# Patient Record
Sex: Female | Born: 1960 | Race: White | Hispanic: No | Smoking: Never smoker
Health system: Southern US, Community
[De-identification: ages and names within clinical notes are randomized; demographics above are authoritative.]

## PROBLEM LIST (undated history)

## (undated) DIAGNOSIS — C50919 Malignant neoplasm of unspecified site of unspecified female breast: Secondary | ICD-10-CM

## (undated) DIAGNOSIS — E785 Hyperlipidemia, unspecified: Secondary | ICD-10-CM

## (undated) DIAGNOSIS — F4541 Pain disorder exclusively related to psychological factors: Secondary | ICD-10-CM

## (undated) DIAGNOSIS — Z923 Personal history of irradiation: Secondary | ICD-10-CM

## (undated) DIAGNOSIS — I10 Essential (primary) hypertension: Secondary | ICD-10-CM

## (undated) HISTORY — PX: BREAST LUMPECTOMY: SHX2

## (undated) HISTORY — DX: Essential (primary) hypertension: I10

## (undated) HISTORY — PX: VAGINAL HYSTERECTOMY: SUR661

## (undated) HISTORY — PX: GANGLION CYST EXCISION: SHX1691

## (undated) HISTORY — DX: Hyperlipidemia, unspecified: E78.5

---

## 2012-06-18 ENCOUNTER — Encounter: Payer: Self-pay | Admitting: Internal Medicine

## 2012-08-02 ENCOUNTER — Encounter: Payer: Self-pay | Admitting: Internal Medicine

## 2012-08-06 ENCOUNTER — Ambulatory Visit (INDEPENDENT_AMBULATORY_CARE_PROVIDER_SITE_OTHER): Payer: BC Managed Care – PPO | Admitting: Internal Medicine

## 2012-08-06 ENCOUNTER — Encounter: Payer: Self-pay | Admitting: Internal Medicine

## 2012-08-06 VITALS — BP 120/78 | HR 80 | Ht 62.5 in | Wt 139.6 lb

## 2012-08-06 DIAGNOSIS — K5900 Constipation, unspecified: Secondary | ICD-10-CM

## 2012-08-06 DIAGNOSIS — K5904 Chronic idiopathic constipation: Secondary | ICD-10-CM | POA: Insufficient documentation

## 2012-08-06 MED ORDER — LINACLOTIDE 145 MCG PO CAPS: 145.0000 ug | ORAL_CAPSULE | Freq: Every day | ORAL | Status: DC

## 2012-08-06 NOTE — Progress Notes (Addendum)
Patient ID: Tonya Oconnell, female   DOB: [birth year], 51 y.o.   MRN: [id number]  SUBJECTIVE: HPI Tonya Oconnell is a 51 yo female with PMH of chronic constipation who seen for evaluation of chronic constipation. The patient is alone today. The patient reports that she has suffered from constipation for years, even decades. She has used MiraLAX 17 g once to twice daily on an ongoing basis for some time. This has not led to the feeling of complete evacuation, though it does induce stools for her. She reports without laxatives she has a bowel movement every 3-4 days, perhaps longer. When taking MiraLAX she has more frequent bowel movements, but notes and the small and often thin.  Her stools are brown without blood or melena. She denies abdominal pain. No nausea or vomiting. No weight loss. She does note rare rectal spasm, which she states happens very infrequently and last about 10 seconds. However, for those 10 seconds the pain can be severe. She has been evaluated by Dr. 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  She reports 2 prior colonoscopies one in 2005 and the other on 07/14/2011. She reports these are a waste reported as normal without concern. She does note borborygmi at night.  Review of Systems  As per history of present illness, otherwise negative   No past medical history on file.  Current Outpatient Prescriptions  Medication Sig Dispense Refill  . gabapentin (NEURONTIN) 300 MG capsule Take 300 mg by mouth daily.      . polyethylene glycol (MIRALAX / GLYCOLAX) packet Take 17 g by mouth 2 (two) times daily.      . valACYclovir (VALTREX) 1000 MG tablet Take 1,000 mg by mouth daily.      . Linaclotide (LINZESS) 145 MCG CAPS Take 1 capsule (145 mcg total) by mouth daily.  30 capsule  3    Allergies  Allergen Reactions  . Sulfa Antibiotics Swelling    Family History  Problem Relation Age of Onset  . Colon polyps Father     History  Substance  Use Topics  . Smoking status: Never Smoker   . Smokeless tobacco: Never Used  . Alcohol Use: Yes    OBJECTIVE: BP 120/78  Pulse 80  Ht 5' 2.5" (1.588 m)  Wt 139 lb 9.6 oz (63.322 kg)  BMI 25.13 kg/m2 Constitutional: Well-developed and well-nourished. No distress. HEENT: Normocephalic and atraumatic. Oropharynx is clear and moist. No oropharyngeal exudate. Conjunctivae are normal. Pupils are equal round and reactive to light. No scleral icterus. Neck: Neck supple. Trachea midline. Cardiovascular: Normal rate, regular rhythm and intact distal pulses. No M/R/G Pulmonary/chest: Effort normal and breath sounds normal. No wheezing, rales or rhonchi. Abdominal: Soft, nontender, nondistended. Bowel sounds active throughout. There are no masses palpable. No hepatosplenomegaly. Extremities: no clubbing, cyanosis, or edema Lymphadenopathy: No cervical adenopathy noted. Neurological: Alert and oriented to person place and time. Skin: Skin is warm and dry. No rashes noted. Psychiatric: Normal mood and affect. Behavior is normal.  Labs and Imaging -- Awaiting records from prior colonoscopy. She also notes her thyroid function is been checked in the last year and she was told it was normal  ASSESSMENT AND PLAN: 51 yo female with PMH of chronic constipation who seen for evaluation of chronic constipation.  1. Chronic idiopathic constipation -- she does not have much in the way of irritable bowel symptoms, and I do think her constipation is more consistent with the chronic idiopathic  variety.  It seems that MiraLAX is inducing bowel movements, but for her these are incomplete and ineffective. I would like to review her previous colonoscopies to ensure that the preps were good and the exams complete. If this is the case, as I expect it is, then I do not think she needs repeat colonoscopy at this time. I recommend a trial of LINZESS 145 mcg daily. We discussed how if necessary, this dose can be increased  to 290 mcg daily. Also like her to discontinue MiraLAX at this time. I will see her back in 2 months time. I have encouraged her to stay well hydrated and eat a well-balanced diet which is rich in fiber.  2. CRC screening -- 51 yo, however she has already had colonoscopy and we will obtain these records  Addendum: Colonoscopy performed 07/14/2011 -- good prep. Exam to the cecum. Normal colonoscopy, recommended repeat 5 years due to family history, Dr. Ree Kida Spainhour

## 2012-08-06 NOTE — Patient Instructions (Addendum)
We have sent the following medications to your pharmacy for you to pick up at your convenience: Linzess  Stop taking miralax  Follow up with Dr. Rhea Belton in 6-8 Weeks

## 2012-08-08 ENCOUNTER — Telehealth: Payer: Self-pay | Admitting: Internal Medicine

## 2012-08-08 NOTE — Telephone Encounter (Signed)
Pt was here on 08/06/12 and was given Linzess. Per Dr Lauro Franklin note, she was to take 145 mcg daily and if ineffective she is to increase to 290 mcg. Pt reports she was reading over her instructions and she was given 290 mcg instead. Pt took that dose and had 3 stools, loose. She lives too far away for samples so she will call to update me on whether to order a script. She uses [facility].

## 2012-08-08 NOTE — Telephone Encounter (Signed)
This dose may be too high for her and if she has too frequent of loose stool then I would decrease to 145 mcg daily

## 2012-08-16 ENCOUNTER — Telehealth: Payer: Self-pay | Admitting: *Deleted

## 2012-08-16 DIAGNOSIS — K5904 Chronic idiopathic constipation: Secondary | ICD-10-CM

## 2012-08-16 MED ORDER — LINACLOTIDE 145 MCG PO CAPS: 145.0000 ug | ORAL_CAPSULE | Freq: Every day | ORAL | Status: DC

## 2012-08-16 NOTE — Telephone Encounter (Signed)
Spoke to pt and she said Linzess is working for her she has taken it for the past 10 days and has had normal stools for 7 of those 10 days. Asked if I could send [facility] an [facility]. Rx has been sent.

## 2012-09-18 ENCOUNTER — Encounter: Payer: Self-pay | Admitting: Internal Medicine

## 2012-09-19 ENCOUNTER — Encounter: Payer: Self-pay | Admitting: Internal Medicine

## 2012-09-19 ENCOUNTER — Ambulatory Visit (INDEPENDENT_AMBULATORY_CARE_PROVIDER_SITE_OTHER): Payer: BC Managed Care – PPO | Admitting: Internal Medicine

## 2012-09-19 ENCOUNTER — Ambulatory Visit: Payer: BC Managed Care – PPO | Admitting: Internal Medicine

## 2012-09-19 VITALS — BP 116/82 | HR 84 | Ht 62.75 in | Wt 140.4 lb

## 2012-09-19 DIAGNOSIS — K5900 Constipation, unspecified: Secondary | ICD-10-CM

## 2012-09-19 DIAGNOSIS — K5904 Chronic idiopathic constipation: Secondary | ICD-10-CM

## 2012-09-19 MED ORDER — LINACLOTIDE 145 MCG PO CAPS: 145.0000 ug | ORAL_CAPSULE | Freq: Every day | ORAL | Status: DC

## 2012-09-19 NOTE — Patient Instructions (Addendum)
We have sent the following medications to your pharmacy for you to pick up at your convenience:  Linzess    

## 2012-09-19 NOTE — Progress Notes (Signed)
  Subjective:    Patient ID: Tonya Oconnell, female    DOB: 05-06-1961, 51 y.o.   MRN: [id number]  HPI Tonya Oconnell is a 51 yo female with PMH of chronic constipation who seen in followup. She is alone today. The patient was prescribed LINZESS 290 mcg, but this was causing too frequent stools with some urgency, and she was decreased to 145 mcg daily. This has worked very well for her and she is very happy with the results. She is now having a bowel movement nearly every day and it feels complete. She has stopped MiraLAX. She continues to abdominal pain, nausea, vomiting. Appetite is good. Still sporadic borborygmi, but not painful or disturbing to her. No fevers or chills.  Review of Systems As per history of present illness, otherwise negative  Current Medications, Allergies, Past Medical History, Past Surgical History, Family History and Social History were reviewed in [employer] record.      Objective:   Physical Exam BP 116/82  Pulse 84  Ht 5' 2.75" (1.594 m)  Wt 140 lb 6 oz (63.674 kg)  BMI 25.06 kg/m2 Constitutional: Well-developed and well-nourished. No distress. HEENT: Normocephalic and atraumatic. Oropharynx is clear and moist. No oropharyngeal exudate. Conjunctivae are normal.  No scleral icterus. Cardiovascular: Normal rate, regular rhythm and intact distal pulses. No M/R/G Pulmonary/chest: Effort normal and breath sounds normal. No wheezing, rales or rhonchi. Abdominal: Soft, nontender, nondistended. Bowel sounds active throughout. There are no masses palpable. No hepatosplenomegaly. Extremities: no clubbing, cyanosis, or edema Neurological: Alert and oriented to person place and time. Skin: Skin is warm and dry. No rashes noted. Psychiatric: Normal mood and affect. Behavior is normal.  Records reviewed and colonoscopy was performed in [place], [place] on 07/14/2011. Exam was to the cecum.      Assessment & Plan:  51 year old female who seen in  followup with chronic idiopathic constipation.  1. Chronic idiopathic constipation -- she has had an excellent response to [facility] and we'll continue 145 mcg daily. Refills will be given. I will see her again in one year, or sooner if needed.  2.  CRC screening, fam hx of CRC -- the patient's records reviewed and she is up-to-date on screening, she will be due repeat screening July 2017. This is a 5 year interval based on her family history.

## 2013-08-09 ENCOUNTER — Other Ambulatory Visit: Payer: Self-pay | Admitting: Internal Medicine

## 2014-03-30 ENCOUNTER — Other Ambulatory Visit: Payer: Self-pay | Admitting: Internal Medicine

## 2015-03-12 ENCOUNTER — Other Ambulatory Visit (INDEPENDENT_AMBULATORY_CARE_PROVIDER_SITE_OTHER): Payer: Self-pay | Admitting: Otolaryngology

## 2015-03-12 DIAGNOSIS — R609 Edema, unspecified: Secondary | ICD-10-CM

## 2015-03-17 ENCOUNTER — Ambulatory Visit (HOSPITAL_COMMUNITY)
Admission: RE | Admit: 2015-03-17 | Discharge: 2015-03-17 | Disposition: A | Discharge status: Home / Self Care | Payer: BLUE CROSS/BLUE SHIELD | Source: Ambulatory Visit | Attending: Otolaryngology | Admitting: Otolaryngology

## 2015-03-17 DIAGNOSIS — R609 Edema, unspecified: Secondary | ICD-10-CM

## 2015-03-17 DIAGNOSIS — R221 Localized swelling, mass and lump, neck: Secondary | ICD-10-CM | POA: Diagnosis not present

## 2015-03-17 MED ORDER — IOHEXOL 300 MG/ML  SOLN
75.0000 mL | Freq: Once | INTRAMUSCULAR | Status: AC | PRN
  Administered 2015-03-17: 75 mL via INTRAVENOUS

## 2015-07-06 ENCOUNTER — Telehealth: Payer: Self-pay | Admitting: Internal Medicine

## 2015-07-06 NOTE — Telephone Encounter (Signed)
Pt states she is taking 2 of the lizess 124mcg now and that sometimes they work and sometimes they don't. Reports that she is having to take a laxative at least once a week in addition to the linzess. Pt states that when she does go the stool is loose. States that it may be 2-3 days before she goes to the bathroom. Please advise.

## 2015-07-06 NOTE — Telephone Encounter (Signed)
Patient's did not always go every day when taking Linzess. Can increase to 290 g daily. Probably best to be seen again in the office to discuss symptoms. Additional over-the-counter laxative can be used as needed until follow-up

## 2015-07-06 NOTE — Telephone Encounter (Signed)
Pt is currently taking 290 daily. Pt will continue trying the higher dose along with laxative as needed. Left message for pt to call back.   Pt scheduled to see Dr. Hilarie Fredrickson 07/08/15@10am . Pt aware of appt.

## 2015-07-08 ENCOUNTER — Encounter: Payer: Self-pay | Admitting: Internal Medicine

## 2015-07-08 ENCOUNTER — Ambulatory Visit (INDEPENDENT_AMBULATORY_CARE_PROVIDER_SITE_OTHER): Payer: BLUE CROSS/BLUE SHIELD | Admitting: Internal Medicine

## 2015-07-08 ENCOUNTER — Telehealth: Payer: Self-pay | Admitting: Internal Medicine

## 2015-07-08 VITALS — BP 118/70 | HR 68 | Ht 63.0 in | Wt 149.0 lb

## 2015-07-08 DIAGNOSIS — M545 Low back pain, unspecified: Secondary | ICD-10-CM

## 2015-07-08 DIAGNOSIS — K5909 Other constipation: Secondary | ICD-10-CM

## 2015-07-08 DIAGNOSIS — R194 Change in bowel habit: Secondary | ICD-10-CM

## 2015-07-08 DIAGNOSIS — K5900 Constipation, unspecified: Secondary | ICD-10-CM | POA: Diagnosis not present

## 2015-07-08 MED ORDER — NA SULFATE-K SULFATE-MG SULF 17.5-3.13-1.6 GM/177ML PO SOLN: 1.0000 | Freq: Once | ORAL | Status: DC

## 2015-07-08 MED ORDER — LUBIPROSTONE 8 MCG PO CAPS: 8.0000 ug | ORAL_CAPSULE | Freq: Two times a day (BID) | ORAL | Status: DC

## 2015-07-08 NOTE — Patient Instructions (Signed)
Discontinue Linzess Start Amitiza 8 mcg Use Benefiber 1 TBS daily for 7 days Then increase to 2 tablespoons daily  You have been scheduled for a colonoscopy. Please follow written instructions given to you at your visit today.  Please pick up your prep supplies at the pharmacy within the next 1-3 days. If you use inhalers (even only as needed), please bring them with you on the day of your procedure. Your physician has requested that you go to www.startemmi.com and enter the access code given to you at your visit today. This web site gives a general overview about your procedure. However, you should still follow specific instructions given to you by our office regarding your preparation for the procedure.

## 2015-07-08 NOTE — Telephone Encounter (Signed)
Informed patient that we can mail her coupons for both Amitiza and Suprep. Pt verbalized understanding.

## 2015-07-08 NOTE — Progress Notes (Signed)
Subjective:    Patient ID: Tonya Oconnell, female    DOB: April 15, 1961, 54 y.o.   MRN: [id number]  HPI Tonya Oconnell is a 54 year old female with past medical history of chronic constipation who seen in follow-up. She was last seen in October 2013.  She has been taking Linzess for the last 2 years at 145 g daily. Despite this she is still having issues with incomplete evacuation. She reports that Linzess causes loose runny stools and this is always been the case. She is not having this every day but whenever she has a bowel movement while using the medication it is always very loose. She denies abdominal pain. She does report feeling incomplete evacuation. She stopped Linzess for several days to see what would happen and increase the fiber in her diet. She reports that she was having bowel movements but they were thin in caliber. She denies rectal bleeding or melena. She's had some lower back pain just above her pelvis. She is worried because she had a friend with lower back pain he was later diagnosed with colon cancer. She does not have a family history of colon cancer but her father had colon polyps. Her last colonoscopy was in July 2012 with Dr. West Carbo.  This test was normal. She does report that it feels like "my stool never makes it to the bottom". She did recently increase Linzess to 290 g daily but had no change in bowel habits   Review of Systems As per history of present illness, otherwise negative  Current Medications, Allergies, Past Medical History, Past Surgical History, Family History and Social History were reviewed in [employer] record.     Objective:   Physical Exam BP 118/70 mmHg  Pulse 68  Ht 5\' 3"  (1.6 m)  Wt 149 lb (67.586 kg)  BMI 26.40 kg/m2 Constitutional: Well-developed and well-nourished. No distress. HEENT: Normocephalic and atraumatic. Oropharynx is clear and moist. No oropharyngeal exudate. Conjunctivae are normal.  No scleral  icterus. Neck: Neck supple. Trachea midline. Cardiovascular: Normal rate, regular rhythm and intact distal pulses. No M/R/G Pulmonary/chest: Effort normal and breath sounds normal. No wheezing, rales or rhonchi. Abdominal: Soft, nontender, nondistended. Bowel sounds active throughout. There are no masses palpable. No hepatosplenomegaly. Extremities: no clubbing, cyanosis, or edema Lymphadenopathy: No cervical adenopathy noted. Neurological: Alert and oriented to person place and time. Skin: Skin is warm and dry. No rashes noted. Psychiatric: Normal mood and affect. Behavior is normal.  Colonoscopy reviewed from 07/14/2011 -- normal. Recommended repeat in 5 years. Prep documented as "good"     Assessment & Plan:  54 year old female with past medical history of chronic constipation who seen in follow-up.  1. Chronic constipation/change in bowel habits/lower back pain -- she is nervous about her symptoms and is worried about colon cancer. At tried to provide reassurance today given her normal colonoscopy from 4 years ago. That said, given her anxiousness and her change in bowel habits including thin stools, I recommended repeat colonoscopy at this time. We discussed the risks, benefits and alternatives and she is agreeable to proceed. Linzess has caused diarrhea and this is not the intended result. I would like to add Benefiber 1 tablespoon daily for 1 week increase to twice daily after 1 week. Hopefully this will help form stools aid with complete evacuation. Discontinue Linzess. Begin lubiprostone 8 g twice a day. Low back pain may relate to constipation but more likely is secondary to low back strain. Finally we discussed the possibility of  pelvic floor dysfunction or dyssynergia defecation. If colonoscopy normal and no response to medication consider anorectal manometry  25 minutes spent with the patient today

## 2015-07-15 ENCOUNTER — Telehealth: Payer: Self-pay | Admitting: Internal Medicine

## 2015-07-15 MED ORDER — NA SULFATE-K SULFATE-MG SULF 17.5-3.13-1.6 GM/177ML PO SOLN: 1.0000 | Freq: Once | ORAL | Status: DC

## 2015-07-15 NOTE — Telephone Encounter (Signed)
Free sample placed at front desk. Patient advised.

## 2015-07-17 ENCOUNTER — Telehealth: Payer: Self-pay | Admitting: Internal Medicine

## 2015-07-17 NOTE — Telephone Encounter (Signed)
Patient reports that she is not having any results from 8 mcg BID amitiza and Benefiber.  She is advised to add Miralax 1-2 doses a day until Dr. Hilarie Fredrickson can review on Monday

## 2015-07-21 NOTE — Telephone Encounter (Signed)
Left message for pt to call back  °

## 2015-07-21 NOTE — Telephone Encounter (Signed)
Increase Amitiza to 24 g twice a day and colonoscopy is pending

## 2015-07-24 NOTE — Telephone Encounter (Signed)
Left message for pt to call back  °

## 2015-07-24 NOTE — Telephone Encounter (Signed)
Pt aware and states she will take the 24mcg pills 3 BID to equal 67mcg dose. Pt will call back and let us know when she needs a new prescription.

## 2015-08-10 ENCOUNTER — Encounter: Payer: Self-pay | Admitting: Internal Medicine

## 2015-08-10 ENCOUNTER — Ambulatory Visit (AMBULATORY_SURGERY_CENTER): Payer: BLUE CROSS/BLUE SHIELD | Admitting: Internal Medicine

## 2015-08-10 VITALS — BP 120/78 | HR 73 | Temp 97.3°F | Resp 20 | Ht 63.0 in | Wt 149.0 lb

## 2015-08-10 DIAGNOSIS — K5900 Constipation, unspecified: Secondary | ICD-10-CM

## 2015-08-10 DIAGNOSIS — R194 Change in bowel habit: Secondary | ICD-10-CM | POA: Diagnosis present

## 2015-08-10 DIAGNOSIS — D120 Benign neoplasm of cecum: Secondary | ICD-10-CM | POA: Diagnosis not present

## 2015-08-10 DIAGNOSIS — K5909 Other constipation: Secondary | ICD-10-CM

## 2015-08-10 HISTORY — PX: COLONOSCOPY WITH PROPOFOL: SHX5780

## 2015-08-10 MED ORDER — SODIUM CHLORIDE 0.9 % IV SOLN: 500.0000 mL | INTRAVENOUS | Status: DC

## 2015-08-10 NOTE — Op Note (Signed)
[place]  [address] [place], [id number]   COLONOSCOPY PROCEDURE REPORT  PATIENT: Tonya Oconnell, Tonya Oconnell  MR#: [id number] BIRTHDATE: Jan 20, 1961 , 54  yrs. old GENDER: female ENDOSCOPIST: Jerene Bears, MD PROCEDURE DATE:  08/10/2015 PROCEDURE:   Colonoscopy, diagnostic and Colonoscopy with snare polypectomy First Screening Colonoscopy - Avg.  risk and is 50 yrs.  old or older - No.  Prior Negative Screening - Now for repeat screening. Less than 10 yrs Prior Negative Screening - Now for repeat screening.  Other: See Comments  History of Adenoma - Now for follow-up colonoscopy & has been > or = to 3 yrs.  N/A  Polyps removed today? Yes ASA CLASS:   Class II INDICATIONS:change in bowel habits and chronic constipation.  normal colonoscopy at outside facility in 2012 MEDICATIONS: Monitored anesthesia care and Propofol 200 mg IV  DESCRIPTION OF PROCEDURE:   After the risks benefits and alternatives of the procedure were thoroughly explained, informed consent was obtained.  The digital rectal exam revealed no rectal mass.   The LB [id number]  endoscope was introduced through the anus and advanced to the cecum, which was identified by both the appendix and ileocecal valve. No adverse events experienced. The quality of the prep was good.  (Suprep was used)  The instrument was then slowly withdrawn as the colon was fully examined. Estimated blood loss is zero unless otherwise noted in this procedure report.  COLON FINDINGS: A sessile polyp measuring 8 mm in size was found at the cecum.  A polypectomy was performed with a cold snare.  The resection was complete, the polyp tissue was completely retrieved and sent to histology.   The examination was otherwise normal. Retroflexed views revealed internal hemorrhoids. The time to cecum = 3.3 Withdrawal time = 10.2   The scope was withdrawn and the procedure completed. COMPLICATIONS: There were no immediate  complications.  ENDOSCOPIC IMPRESSION: 1.   Sessile polyp was found at the cecum; polypectomy was performed with a cold snare 2.   The examination was otherwise normal  RECOMMENDATIONS: 1.  Await pathology results 2.  If the polyp removed today is proven to be an adenomatous (pre-cancerous) polyp, you will need a repeat colonoscopy in 5 years.  Otherwise you should continue to follow colorectal cancer screening guidelines for "routine risk" patients with colonoscopy in 10 years.  You will receive a letter within 1-2 weeks with the results of your biopsy as well as final recommendations.  Please call my office if you have not received a letter after 3 weeks. 3.  Discontinue Amitiza, resume Benefiber 1-2 tablespoons daily. Office follow-up as needed, call if having ongoing issues with constipation  eSigned:  Jerene Bears, MD 08/10/2015 4:07 PM   cc:  the patient, PCP

## 2015-08-10 NOTE — Progress Notes (Signed)
Called to room to assist during endoscopic procedure.  Patient ID and intended procedure confirmed with present staff. Received instructions for my participation in the procedure from the performing physician.  

## 2015-08-10 NOTE — Patient Instructions (Addendum)
YOU HAD AN ENDOSCOPIC PROCEDURE TODAY AT [facility]:   Refer to the procedure report that was given to you for any specific questions about what was found during the examination.  If the procedure report does not answer your questions, please call your gastroenterologist to clarify.  If you requested that your care partner not be given the details of your procedure findings, then the procedure report has been included in a sealed envelope for you to review at your convenience later.  YOU SHOULD EXPECT: Some feelings of bloating in the abdomen. Passage of more gas than usual.  Walking can help get rid of the air that was put into your GI tract during the procedure and reduce the bloating. If you had a lower endoscopy (such as a colonoscopy or flexible sigmoidoscopy) you may notice spotting of blood in your stool or on the toilet paper. If you underwent a bowel prep for your procedure, you may not have a normal bowel movement for a few days.  Please Note:  You might notice some irritation and congestion in your nose or some drainage.  This is from the oxygen used during your procedure.  There is no need for concern and it should clear up in a day or so.  SYMPTOMS TO REPORT IMMEDIATELY:   Following lower endoscopy (colonoscopy or flexible sigmoidoscopy):  Excessive amounts of blood in the stool  Significant tenderness or worsening of abdominal pains  Swelling of the abdomen that is new, acute  Fever of 100F or higher     For urgent or emergent issues, a gastroenterologist can be reached at any hour by calling [phone].   DIET: Your first meal following the procedure should be a small meal and then it is ok to progress to your normal diet. Heavy or fried foods are harder to digest and may make you feel nauseous or bloated.  Likewise, meals heavy in dairy and vegetables can increase bloating.  Drink plenty of fluids but you should avoid alcoholic beverages for 24  hours.  ACTIVITY:  You should plan to take it easy for the rest of today and you should NOT DRIVE or use heavy machinery until tomorrow (because of the sedation medicines used during the test).    FOLLOW UP: Our staff will call the number listed on your records the next business day following your procedure to check on you and address any questions or concerns that you may have regarding the information given to you following your procedure. If we do not reach you, we will leave a message.  However, if you are feeling well and you are not experiencing any problems, there is no need to return our call.  We will assume that you have returned to your regular daily activities without incident.  If any biopsies were taken you will be contacted by phone or by letter within the next 1-3 weeks.  Please call us at [phone] if you have not heard about the biopsies in 3 weeks.    SIGNATURES/CONFIDENTIALITY: You and/or your care partner have signed paperwork which will be entered into your electronic medical record.  These signatures attest to the fact that that the information above on your After Visit Summary has been reviewed and is understood.  Full responsibility of the confidentiality of this discharge information lies with you and/or your care-partner.   INFORMATION ON POLYPS GIVEN TO YOU TODAY  BENEFIBER 1-2 TBSP DAILY AS DIRECTED

## 2015-08-10 NOTE — Progress Notes (Signed)
Transferred to recovery room. A/O x3, pleased with MAC.  VSS.  Report to Penny, RN. 

## 2015-08-11 ENCOUNTER — Telehealth: Payer: Self-pay | Admitting: Emergency Medicine

## 2015-08-11 NOTE — Telephone Encounter (Signed)
  Follow up Call-  Call back number 08/10/2015  Post procedure Call Back phone  # [phone]  Permission to leave phone message Yes     Patient questions:  Do you have a fever, pain , or abdominal swelling? No. Pain Score  0 *  Have you tolerated food without any problems? Yes.    Have you been able to return to your normal activities? Yes.    Do you have any questions about your discharge instructions: Diet   No. Medications  No. Follow up visit  No.  Do you have questions or concerns about your Care? No.  Actions: * If pain score is 4 or above: No action needed, pain <4.

## 2015-08-19 ENCOUNTER — Encounter: Payer: Self-pay | Admitting: Internal Medicine

## 2015-08-26 ENCOUNTER — Telehealth: Payer: Self-pay | Admitting: Internal Medicine

## 2015-08-26 NOTE — Telephone Encounter (Signed)
Discussed with pt that the anesthesia should be well out of her system by now and should not be causing the swelling, pt had colon done 08/10/15. Pt states she will discuss with her PCP.

## 2016-04-18 ENCOUNTER — Encounter: Payer: Self-pay | Admitting: Internal Medicine

## 2016-05-09 ENCOUNTER — Telehealth: Payer: Self-pay | Admitting: Internal Medicine

## 2016-05-09 NOTE — Telephone Encounter (Signed)
Reviewed recall letter with pt and explained letter to pt. Questions were answered.

## 2017-04-05 ENCOUNTER — Other Ambulatory Visit: Payer: Self-pay | Admitting: Specialist

## 2017-04-05 DIAGNOSIS — R928 Other abnormal and inconclusive findings on diagnostic imaging of breast: Secondary | ICD-10-CM

## 2017-04-07 ENCOUNTER — Ambulatory Visit
Admission: RE | Admit: 2017-04-07 | Discharge: 2017-04-07 | Disposition: A | Discharge status: Home / Self Care | Payer: BLUE CROSS/BLUE SHIELD | Source: Ambulatory Visit | Attending: Specialist | Admitting: Specialist

## 2017-04-07 DIAGNOSIS — R928 Other abnormal and inconclusive findings on diagnostic imaging of breast: Secondary | ICD-10-CM

## 2017-04-12 ENCOUNTER — Telehealth: Payer: Self-pay | Admitting: *Deleted

## 2017-04-12 NOTE — Telephone Encounter (Deleted)
Left vm regarding [place] on 04/19/17. Contact information provided for return call.

## 2017-04-12 NOTE — Telephone Encounter (Signed)
error 

## 2017-04-12 NOTE — Telephone Encounter (Signed)
Confirmed [facility] for 04/19/17 at 1215 .  Instructions and contact information given.

## 2017-04-17 ENCOUNTER — Encounter: Payer: Self-pay | Admitting: *Deleted

## 2017-04-17 ENCOUNTER — Other Ambulatory Visit: Payer: Self-pay | Admitting: *Deleted

## 2017-04-17 DIAGNOSIS — Z170 Estrogen receptor positive status [ER+]: Principal | ICD-10-CM

## 2017-04-17 DIAGNOSIS — C50411 Malignant neoplasm of upper-outer quadrant of right female breast: Secondary | ICD-10-CM

## 2017-04-18 ENCOUNTER — Encounter: Payer: Self-pay | Admitting: Genetics

## 2017-04-18 DIAGNOSIS — C50919 Malignant neoplasm of unspecified site of unspecified female breast: Secondary | ICD-10-CM

## 2017-04-18 HISTORY — DX: Malignant neoplasm of unspecified site of unspecified female breast: C50.919

## 2017-04-19 ENCOUNTER — Other Ambulatory Visit (HOSPITAL_BASED_OUTPATIENT_CLINIC_OR_DEPARTMENT_OTHER): Payer: BLUE CROSS/BLUE SHIELD

## 2017-04-19 ENCOUNTER — Encounter: Payer: Self-pay | Admitting: Hematology and Oncology

## 2017-04-19 ENCOUNTER — Ambulatory Visit
Admission: RE | Admit: 2017-04-19 | Discharge: 2017-04-19 | Disposition: A | Discharge status: Home / Self Care | Payer: BLUE CROSS/BLUE SHIELD | Source: Ambulatory Visit | Attending: Radiation Oncology | Admitting: Radiation Oncology

## 2017-04-19 ENCOUNTER — Ambulatory Visit: Payer: BLUE CROSS/BLUE SHIELD | Attending: Surgery | Admitting: Physical Therapy

## 2017-04-19 ENCOUNTER — Ambulatory Visit: Payer: Self-pay | Admitting: Surgery

## 2017-04-19 ENCOUNTER — Ambulatory Visit (HOSPITAL_BASED_OUTPATIENT_CLINIC_OR_DEPARTMENT_OTHER): Payer: BLUE CROSS/BLUE SHIELD | Admitting: Hematology and Oncology

## 2017-04-19 ENCOUNTER — Encounter: Payer: Self-pay | Admitting: Physical Therapy

## 2017-04-19 DIAGNOSIS — Z170 Estrogen receptor positive status [ER+]: Secondary | ICD-10-CM | POA: Insufficient documentation

## 2017-04-19 DIAGNOSIS — C50411 Malignant neoplasm of upper-outer quadrant of right female breast: Secondary | ICD-10-CM | POA: Diagnosis not present

## 2017-04-19 DIAGNOSIS — Z8371 Family history of colonic polyps: Secondary | ICD-10-CM | POA: Diagnosis not present

## 2017-04-19 DIAGNOSIS — R293 Abnormal posture: Secondary | ICD-10-CM | POA: Diagnosis present

## 2017-04-19 LAB — COMPREHENSIVE METABOLIC PANEL
ALT: 14 U/L (ref 0–55)
AST: 16 U/L (ref 5–34)
Albumin: 4.1 g/dL (ref 3.5–5.0)
Alkaline Phosphatase: 84 U/L (ref 40–150)
Anion Gap: 11 mEq/L (ref 3–11)
BILIRUBIN TOTAL: 0.29 mg/dL (ref 0.20–1.20)
BUN: 9.7 mg/dL (ref 7.0–26.0)
CO2: 28 meq/L (ref 22–29)
Calcium: 9.6 mg/dL (ref 8.4–10.4)
Chloride: 105 mEq/L (ref 98–109)
Creatinine: 1 mg/dL (ref 0.6–1.1)
EGFR: 68 mL/min/{1.73_m2} — AB (ref >90)
GLUCOSE: 113 mg/dL (ref 70–140)
Potassium: 3.8 mEq/L (ref 3.5–5.1)
SODIUM: 144 meq/L (ref 136–145)
TOTAL PROTEIN: 7.4 g/dL (ref 6.4–8.3)

## 2017-04-19 LAB — CBC WITH DIFFERENTIAL/PLATELET
BASO%: 0.8 % (ref 0.0–2.0)
Basophils Absolute: 0.1 10*3/uL (ref 0.0–0.1)
EOS%: 2 % (ref 0.0–7.0)
Eosinophils Absolute: 0.1 10*3/uL (ref 0.0–0.5)
HCT: 40.1 % (ref 34.8–46.6)
HEMOGLOBIN: 13.4 g/dL (ref 11.6–15.9)
LYMPH%: 30 % (ref 14.0–49.7)
MCH: 29.3 pg (ref 25.1–34.0)
MCHC: 33.4 g/dL (ref 31.5–36.0)
MCV: 87.7 fL (ref 79.5–101.0)
MONO#: 0.3 10*3/uL (ref 0.1–0.9)
MONO%: 4.9 % (ref 0.0–14.0)
NEUT#: 3.8 10*3/uL (ref 1.5–6.5)
NEUT%: 62.3 % (ref 38.4–76.8)
NRBC: 0 % (ref 0–0)
Platelets: 360 10*3/uL (ref 145–400)
RBC: 4.57 10*6/uL (ref 3.70–5.45)
RDW: 14 % (ref 11.2–14.5)
WBC: 6.1 10*3/uL (ref 3.9–10.3)
lymph#: 1.8 10*3/uL (ref 0.9–3.3)

## 2017-04-19 NOTE — H&P (Signed)
Tonya Oconnell 04/19/2017 7:49 AM Location: [facility] Patient #: [id number] DOB: 1961/09/16 Undefined / Language: Undefined / Race: Refused to Report/Unreported Female  History of Present Illness (Sunday Klos A. Ridhi Hoffert MD; 04/19/2017 3:03 PM) Patient words: Pt sent at the request of Dr Lisbeth Renshaw for right breast cancer. Pt had an abnormal mammogram. A 1.3 cm mass right breast upper outer quadrant was noted and core bx showed IDC ER+ PR + HER 2 NEU NEGATIVE. No hx of breast mass pain or nipple discharge              ADDITIONAL INFORMATION: PROGNOSTIC INDICATORS Results: IMMUNOHISTOCHEMICAL AND MORPHOMETRIC ANALYSIS PERFORMED MANUALLY Estrogen Receptor: 90%, POSITIVE, STRONG STAINING INTENSITY Progesterone Receptor: 80%, POSITIVE, STRONG STAINING INTENSITY Proliferation Marker Ki67: 2% REFERENCE RANGE ESTROGEN RECEPTOR NEGATIVE 0% POSITIVE =>1% REFERENCE RANGE PROGESTERONE RECEPTOR NEGATIVE 0% POSITIVE =>1% All controls stained appropriately Claudette Laws MD Pathologist, Electronic Signature ( Signed 04/13/2017) FLUORESCENCE IN-SITU HYBRIDIZATION Results: HER2 - NEGATIVE RATIO OF HER2/CEP17 SIGNALS 1.44 AVERAGE HER2 COPY NUMBER PER CELL 1.95 Reference Range: NEGATIVE HER2/CEP17 Ratio <2.0 and average HER2 copy number <4.0 EQUIVOCAL HER2/CEP17 Ratio <2.0 and average HER2 copy number >=4.0 and <6.0 1 of 3 FINAL for Tonya Oconnell, Tonya Oconnell ([id number]) ADDITIONAL INFORMATION:(continued) POSITIVE HER2/CEP17 Ratio >=2.0 or <2.0 and average HER2 copy number >=6.0 [name] MD Pathologist, Electronic Signature ( Signed 04/12/2017) FINAL DIAGNOSIS Diagnosis Breast, right, needle core biopsy, 11:30 o'clock - 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 Immunohistochemistry was performed to assess for invasion. SMM, p63 and calponin are negative in the  foci of interest confirming the absence of a myoepithelial cell layer which supports the diagnosis of invasive carcinoma. The tumor is Nottingham Grade 1 of 3 based on the biopsy material. Dr. Tresa Moore reviewed the case and agrees with the above assessment. This case was called to [facility] on April 11, 2017. Prognostic markers are pending and will be reported in an addendum. Thressa Sheller MD Pathologist, Electronic Signature (Case signed 04/11/2017) Specimen Gross and Clinical Information Specimen Comment In formalin: 2:54 PM; extracted < 5 min; distortion/mass Specimen(s) Obtained: Breast, right, needle core biopsy, 11:30 o'clock Specimen Clinical Information Suspect [facility] Gross Received labeled "[name],[name]" and "Rt breast 1130" (TIF 1454 CIT <83mn) are 3 cores of gray white to yellow soft to firm tissue, ranging from 1.1 x 0.1 x 0.1 cm to 1.6 x 0.15 x 0.1 cm. One block submitted. (SSW 4/20) Stain(s) used in Diagnosis: The following stain(s) were used in diagnosing the case: P63, PR-ACIS, KI-67-ACIS, Her2 FISH, ER-ACIS, Smooth Muscle Myosin - 1 Heavy Chain, Calponin. The control(s) stained appropriately. 2 of 3 FINAL for Tonya Oconnell, Tonya Oconnell [phone] Disclaimer Ki-67 (MM1), immunohistochemical stains are performed on formalin fixed, paraffin embedded tissue using a 3,3"-diaminobenzidine (DAB) chromogen and [place] The staining intensity of the nucleus is scored manually and is reported as the percentage of tumor cell nuclei demonstrating specific nuclear staining.Specimens are fixed in 10% Neutral Buffered Formalin for at least 6 hours and up to 72 hours. These tests have not be validated on decalcified tissue. Results should be interpreted with caution given the possibility of false negative results on decalcified specimens. Estrogen receptor (6F11), immunohistochemical stains are performed on formalin fixed, paraffin embedded tissue using a  3,3"-diaminobenzidine (DAB) chromogen and Leica Bond Autostainer System. The staining intensity of the nucleus is scored manually and is reported as the percentage of tumor cell nuclei  demonstrating specific nuclear staining.Specimens are fixed in 10% Neutral Buffered Formalin for at least 6 hours and up to 72 hours. These tests have not be validated on decalcified tissue. Results should be interpreted with caution given the possibility of false negative results on decalcified specimens. HER2 IQFISH pharmDX (code [id number]) is a direct fluorescence in-situ hybridization assay designed to quantitatively determine HER2 gene amplification in formalin-fixed, paraffin-embedded tissue specimens. It is performed at [facility] and is reported using ASCO/CAP scoring criteria published in 2013. Some of these immunohistochemical stains may have been developed and the performance characteristics determined by [facility]. Some may not have been cleared or approved by the U.S. Food and Drug Administration. The FDA has determined that such clearance or approval is not necessary. This test is used for clinical purposes. It should not be regarded as investigational or for research. This laboratory is certified under the [employer] (CLIA-88) as qualified to perform high complexity clinical laboratory testing. PR progesterone receptor (16), immunohistochemical stains are performed on formalin fixed, paraffin embedded tissue using a 3,3"-diaminobenzidine (DAB) chromogen and Leica Bond Autostainer System. The staining intensity of the nucleus is scored manually and is reported as the percentage of tumor cell nuclei demonstrating specific nuclear staining.Specimens are fixed in 10% Neutral Buffered Formalin for at least 6 hours and up to 72 hours. These tests have not be validated on decalcified tissue. Results should be interpreted with caution given the possibility  of false negative results on decalcified specimens. Report signed out from the following location(s) Technical Component was performed at [facility]. [address],[place] [address].[id number]., Technical component and interpretation was performed at [facility], [place], [place] [address]. CLIA #: [id number], 3 of     CLINICAL DATA: Status post ultrasound-guided core biopsy of mass in the 11:30 o'clock location of the right breast.  EXAM: DIAGNOSTIC RIGHT MAMMOGRAM POST ULTRASOUND BIOPSY  COMPARISON: Previous exam(s).  FINDINGS: Mammographic images were obtained following ultrasound guided biopsy of mass in the 11:30 o'clock location of the right breast. A ribbon shaped clip is identified in the upper-outer quadrant of the right breast as expected. Clip is centered within mammographically detected distortion.  IMPRESSION: Tissue marker clip in the expected location following biopsy.  Final Assessment: Post Procedure Mammograms for Marker Placement   Electronically Signed By: Nolon Nations M.D. On: 04/07/2017 15:40.  The patient is a 56 year old female.   Past Surgical History Tawni Pummel, RN; 04/19/2017 7:49 AM) Breast Biopsy Right. multiple Hysterectomy (not due to cancer) - Partial  Diagnostic Studies History Tawni Pummel, RN; 04/19/2017 7:49 AM) Colonoscopy 1-5 years ago Mammogram within last year Pap Smear 1-5 years ago  Medication History Tawni Pummel, RN; 04/19/2017 7:49 AM) Medications Reconciled  Social History Tawni Pummel, RN; 04/19/2017 7:49 AM) Caffeine use Coffee. No alcohol use No drug use Tobacco use Never smoker.  Family History Tawni Pummel, RN; 04/19/2017 7:49 AM) Arthritis Father. Depression Father.  Pregnancy / Birth History Tawni Pummel, RN; 04/19/2017 7:49 AM) Age at menarche 71 years. Age of menopause 31-50 Contraceptive  History Oral contraceptives. Gravida 2 Length (months) of breastfeeding 3-6 Maternal age 27-30 Para 1     Review of Systems Tawni Pummel RN; 04/19/2017 7:49 AM) General Not Present- Appetite Loss, Chills, Fatigue, Fever, Night Sweats, Weight Gain and Weight Loss. Skin Not Present- Change in Wart/Mole, Dryness, Hives, Jaundice, New Lesions, Non-Healing Wounds, Rash and Ulcer. HEENT Present- Wears glasses/contact lenses. Not  Present- Earache, Hearing Loss, Hoarseness, Nose Bleed, Oral Ulcers, Ringing in the Ears, Seasonal Allergies, Sinus Pain, Sore Throat, Visual Disturbances and Yellow Eyes. Respiratory Not Present- Bloody sputum, Chronic Cough, Difficulty Breathing, Snoring and Wheezing. Breast Present- Breast Mass. Not Present- Breast Pain, Nipple Discharge and Skin Changes. Cardiovascular Not Present- Chest Pain, Difficulty Breathing Lying Down, Leg Cramps, Palpitations, Rapid Heart Rate, Shortness of Breath and Swelling of Extremities. Gastrointestinal Not Present- Abdominal Pain, Bloating, Bloody Stool, Change in Bowel Habits, Chronic diarrhea, Constipation, Difficulty Swallowing, Excessive gas, Gets full quickly at meals, Hemorrhoids, Indigestion, Nausea, Rectal Pain and Vomiting. Female Genitourinary Not Present- Frequency, Nocturia, Painful Urination, Pelvic Pain and Urgency. Musculoskeletal Not Present- Back Pain, Joint Pain, Joint Stiffness, Muscle Pain, Muscle Weakness and Swelling of Extremities. Neurological Not Present- Decreased Memory, Fainting, Headaches, Numbness, Seizures, Tingling, Tremor, Trouble walking and Weakness. Psychiatric Not Present- Anxiety, Bipolar, Change in Sleep Pattern, Depression, Fearful and Frequent crying. Endocrine Not Present- Cold Intolerance, Excessive Hunger, Hair Changes, Heat Intolerance, Hot flashes and New Diabetes. Hematology Not Present- Blood Thinners, Easy Bruising, Excessive bleeding, Gland problems, HIV and Persistent  Infections.   Physical Exam ( A.  MD; 04/19/2017 2:56 PM)  General Mental Status-Alert. General Appearance-Consistent with stated age. Hydration-Well hydrated. Voice-Normal.  Head and Neck Head-normocephalic, atraumatic with no lesions or palpable masses. Trachea-midline. Thyroid Gland Characteristics - normal size and consistency.  Chest and Lung Exam Chest and lung exam reveals -quiet, even and easy respiratory effort with no use of accessory muscles and on auscultation, normal breath sounds, no adventitious sounds and normal vocal resonance. Inspection Chest Wall - Normal. Back - normal.  Breast Breast - Left-Symmetric, Non Tender, No Biopsy scars, no Dimpling, No Inflammation, No Lumpectomy scars, No Mastectomy scars, No Peau d' Orange. Breast - Right-Symmetric, Non Tender, No Biopsy scars, no Dimpling, No Inflammation, No Lumpectomy scars, No Mastectomy scars, No Peau d' Orange. Breast Lump-No Palpable Breast Mass. Note: bruising right breast  Cardiovascular Cardiovascular examination reveals -normal heart sounds, regular rate and rhythm with no murmurs and normal pedal pulses bilaterally.  Neurologic Neurologic evaluation reveals -alert and oriented x 3 with no impairment of recent or remote memory. Mental Status-Normal.  Musculoskeletal Normal Exam - Left-Upper Extremity Strength Normal and Lower Extremity Strength Normal. Normal Exam - Right-Upper Extremity Strength Normal and Lower Extremity Strength Normal.  Lymphatic Head & Neck  General Head & Neck Lymphatics: Bilateral - Description - Normal. Axillary  General Axillary Region: Bilateral - Description - Normal. Tenderness - Non Tender.    Assessment & Plan ( A.  MD; 04/19/2017 2:58 PM)  BREAST CANCER, RIGHT (C50.911) Impression: right breast cancer Discussed lumpectomy vs mastectomy and SLN mapping Risk of lumpectomy include bleeding, infection,  seroma, more surgery, use of seed/wire, wound care, cosmetic deformity and the need for other treatments, death , blood clots, death. Pt agrees to proceed. Risk of sentinel lymph node mapping include bleeding, infection, lymphedema, shoulder pain. stiffness, dye allergy. cosmetic deformity , blood clots, death, need for more surgery. Pt agres to proceed.  Current Plans You are being scheduled for surgery- Our schedulers will call you.  You should hear from our office's scheduling department within 5 working days about the location, date, and time of surgery. We try to make accommodations for patient's preferences in scheduling surgery, but sometimes the OR schedule or the surgeon's schedule prevents [place] from making those accommodations.  If you have not heard from our office [phone]) in 5 working days, call the office and ask  for your surgeon's nurse.  If you have other questions about your diagnosis, plan, or surgery, call the office and ask for your surgeon's nurse.  Pt Education - [facility] Breast Cancer Information Given - [employer] "Breast Journey" Package We discussed the staging and pathophysiology of breast cancer. We discussed all of the different options for treatment for breast cancer including surgery, chemotherapy, radiation therapy, Herceptin, and antiestrogen therapy. We discussed a sentinel lymph node biopsy as she does not appear to having lymph node involvement right now. We discussed the performance of that with injection of radioactive tracer and blue dye. We discussed that she would have an incision underneath her axillary hairline. We discussed that there is a bout a 10-20% chance of having a positive node with a sentinel lymph node biopsy and we will await the permanent pathology to make any other first further decisions in terms of her treatment. One of these options might be to return to the operating room to perform an axillary lymph node dissection. We discussed about a 1-2%  risk lifetime of chronic shoulder pain as well as lymphedema associated with a sentinel lymph node biopsy. We discussed the options for treatment of the breast cancer which included lumpectomy versus a mastectomy. We discussed the performance of the lumpectomy with a wire placement. We discussed a 10-20% chance of a positive margin requiring reexcision in the operating room. We also discussed that she may need radiation therapy or antiestrogen therapy or both if she undergoes lumpectomy. We discussed the mastectomy and the postoperative care for that as well. We discussed that there is no difference in her survival whether she undergoes lumpectomy with radiation therapy or antiestrogen therapy versus a mastectomy. There is a slight difference in the local recurrence rate being 3-5% with lumpectomy and about 1% with a mastectomy. We discussed the risks of operation including bleeding, infection, possible reoperation. She understands her further therapy will be based on what her stages at the time of her operation.  Pt Education - [facility] breast cancer surgery: discussed with patient and provided information. Pt Education - [facility] Breast Biopsy HCI: discussed with patient and provided information. Pt Education - ABC (After Breast Cancer) Class Info: discussed with patient and provided information.

## 2017-04-19 NOTE — Patient Instructions (Signed)

## 2017-04-19 NOTE — Progress Notes (Signed)
Radiation Oncology         [phone]) [phone] ________________________________  Name: Tonya Oconnell MRN: [id number]  Date: 04/19/2017  DOB: October 08, 1961  [id number] Norma Fredrickson, MD  Erroll Luna, MD     REFERRING PHYSICIAN: Erroll Luna, MD   DIAGNOSIS: The encounter diagnosis was Malignant neoplasm of upper-outer quadrant of right breast in female, estrogen receptor positive ([place]).   HISTORY OF PRESENT ILLNESS: Tonya Oconnell is a 56 y.o. female seen in the multidisciplinary breast clinic for a new diagnosis of right breast cancer. The patient was found to have a screening assymetry in the right breast on diagnostic ultrasound, at 11:30 there was a 1.3 x .8 x .6 cm mass, and her axilla was negative for adenopathy. A biopsy on 04/07/17 revealed a grade 1, ER/ PR positive, HER2 negative invasive ductal carcinoma with a Ki 67 of 2%. She comes today to discuss options for her cancer care.   PREVIOUS RADIATION THERAPY: No   PAST MEDICAL HISTORY:  Past Medical History:  Diagnosis Date  . Constipation   . Multiple thyroid nodules        PAST SURGICAL HISTORY: Past Surgical History:  Procedure Laterality Date  . BREAST BIOPSY    . GANGLION CYST EXCISION     left wrist  . VAGINAL HYSTERECTOMY       FAMILY HISTORY:  Family History  Problem Relation Age of Onset  . Colon polyps Father      SOCIAL HISTORY:  reports that she has never smoked. She has never used smokeless tobacco. She reports that she does not drink alcohol or use drugs. The patient is divorced and lives in [place], [place].   ALLERGIES: Sulfa antibiotics   MEDICATIONS:  Current Outpatient Prescriptions  Medication Sig Dispense Refill  . valACYclovir (VALTREX) 1000 MG tablet Take 1,000 mg by mouth daily.     No current facility-administered medications for this encounter.      REVIEW OF SYSTEMS: On review of systems, the patient reports that she is doing well overall. She denies any chest pain, shortness of breath,  cough, fevers, chills, night sweats, unintended weight changes. She denies any bowel or bladder disturbances, and denies abdominal pain, nausea or vomiting. She denies any new musculoskeletal or joint aches or pains. A complete review of systems is obtained and is otherwise negative.     PHYSICAL EXAM:  Wt Readings from Last 3 Encounters:  04/19/17 157 lb (71.2 kg)  08/10/15 149 lb (67.6 kg)  07/08/15 149 lb (67.6 kg)   Temp Readings from Last 3 Encounters:  04/19/17 98.1 F (36.7 C) (Oral)  08/10/15 97.3 F (36.3 C) (Tympanic)   BP Readings from Last 3 Encounters:  04/19/17 (!) 153/88  08/10/15 120/78  07/08/15 118/70   Pulse Readings from Last 3 Encounters:  04/19/17 82  08/10/15 73  07/08/15 68    In general this is a well appearing caucasian female in no acute distress. She is alert and oriented x4 and appropriate throughout the examination. HEENT reveals that the patient is normocephalic, atraumatic. EOMs are intact. PERRLA. Skin is intact without any evidence of gross lesions. Cardiovascular exam reveals a regular rate and rhythm, no clicks rubs or murmurs are auscultated. Chest is clear to auscultation bilaterally. Lymphatic assessment is performed and does not reveal any adenopathy in the cervical, supraclavicular, axillary, or inguinal chains. Abdomen has active bowel sounds in all quadrants and is intact.  Bilateral breast exam is performed and reveals palpable fullness in the right breast deep to  her biopsy site. No palpable mass is otherwise noted. Minimal ecchymosis is seen as well. The left breast does not have any palpable abnormalities. No nipple bleeding or discharge is noted of either breast. The abdomen is soft, non tender, non distended. Lower extremities are negative for pretibial pitting edema, deep calf tenderness, cyanosis or clubbing.   ECOG = 0  0 - Asymptomatic (Fully active, able to carry on all predisease activities without restriction)  1 - Symptomatic  but completely ambulatory (Restricted in physically strenuous activity but ambulatory and able to carry out work of a light or sedentary nature. For example, light housework, office work)  2 - Symptomatic, <50% in bed during the day (Ambulatory and capable of all self care but unable to carry out any work activities. Up and about more than 50% of waking hours)  3 - Symptomatic, >50% in bed, but not bedbound (Capable of only limited self-care, confined to bed or chair 50% or more of waking hours)  4 - Bedbound (Completely disabled. Cannot carry on any self-care. Totally confined to bed or chair)  5 - Death   Eustace Pen MM, Creech RH, Tormey DC, et al. [phone]). "Toxicity and response criteria of the [facility]". [place] Oncol. 5 (6): 649-55    LABORATORY DATA:  Lab Results  Component Value Date   WBC 6.1 04/19/2017   HGB 13.4 04/19/2017   HCT 40.1 04/19/2017   MCV 87.7 04/19/2017   PLT 360 04/19/2017   Lab Results  Component Value Date   NA 144 04/19/2017   K 3.8 04/19/2017   CO2 28 04/19/2017   Lab Results  Component Value Date   ALT 14 04/19/2017   AST 16 04/19/2017   ALKPHOS 84 04/19/2017   BILITOT 0.29 04/19/2017      RADIOGRAPHY: Mm Clip Placement Right  Result Date: 04/07/2017 CLINICAL DATA:  Status post ultrasound-guided core biopsy of mass in the 11:30 o'clock location of the right breast. EXAM: DIAGNOSTIC RIGHT MAMMOGRAM POST ULTRASOUND BIOPSY COMPARISON:  Previous exam(s). FINDINGS: Mammographic images were obtained following ultrasound guided biopsy of mass in the 11:30 o'clock location of the right breast. A ribbon shaped clip is identified in the upper-outer quadrant of the right breast as expected. Clip is centered within mammographically detected distortion. IMPRESSION: Tissue marker clip in the expected location following biopsy. Final Assessment: Post Procedure Mammograms for Marker Placement Electronically Signed   By: Nolon Nations  M.D.   On: 04/07/2017 15:40   [place] Rt Breast Bx W Loc Dev 1st Lesion Img Bx Spec US Guide  Addendum Date: 04/11/2017   ADDENDUM REPORT: 04/11/2017 13:47 ADDENDUM: Pathology revealed GRADE I INVASIVE DUCTAL CARCINOMA WITH MICROCALCIFICATIONS, FIBROCYSTIC CHANGE of the Right breast, 11:30 o'clock. This was found to be concordant by Dr. Nolon Nations. Pathology results were discussed with the patient by telephone. The patient reported doing well after the biopsy with tenderness at the site. Post biopsy instructions and care were reviewed and questions were answered. The patient was encouraged to call [facility] for any additional concerns. The patient was referred to [facility] on Apr 19, 2017. Pathology results reported by Terie Purser, RN on 04/11/2017. Electronically Signed   By: Nolon Nations M.D.   On: 04/11/2017 13:47   Addendum Date: 04/11/2017   ADDENDUM REPORT: 04/11/2017 10:21 ADDENDUM: Evaluation of the right axilla shows no adenopathy. Electronically Signed   By: Benjamine Mola  Owens Shark M.D.   On: 04/11/2017 10:21   Result Date: 04/11/2017 CLINICAL DATA:  Patient presents for ultrasound-guided core biopsy of a mass in the 11:30 o'clock location the right breast. EXAM: ULTRASOUND GUIDED RIGHT BREAST CORE NEEDLE BIOPSY COMPARISON:  Prior studies performed at [facility] 04/04/2017 and earlier FINDINGS: I met with the patient and we discussed the procedure of ultrasound-guided biopsy, including benefits and alternatives. We discussed the high likelihood of a successful procedure. We discussed the risks of the procedure, including infection, bleeding, tissue injury, clip migration, and inadequate sampling. Informed written consent was given. The usual time-out protocol was performed immediately prior to the procedure. Lesion quadrant: Right upper outer quadrant Using sterile technique and 1% Lidocaine  as local anesthetic, under direct ultrasound visualization, a 12 gauge spring-loaded device was used to perform biopsy of mass in the 11:30 o'clock location of the right breast using a lateral approach. At the conclusion of the procedure a ribbon shaped tissue marker clip was deployed into the biopsy cavity. Follow up 2 view mammogram was performed and dictated separately. IMPRESSION: Ultrasound guided biopsy of right breast mass. No apparent complications. Electronically Signed: By: Nolon Nations M.D. On: 04/07/2017 15:37       IMPRESSION/PLAN: 1. Stage IA, cT1c, N0, Mx, grade 1, ER/PR positive invasive ductal carcinoma of the right breast. Dr. Lisbeth Renshaw discusses the pathology findings and reviews the nature of invasive breast disease. The consensus from the breast conference include breast conservation with lumpectomy with  sentinel mapping. Oncotype testing would be pursued. Provided that chemotherapy is not indicated, the patient's course would then be followed by external radiotherapy to the breast followed by antiestrogen therapy. We discussed the risks, benefits, short, and long term effects of radiotherapy, and the patient is interested in proceeding. Dr. Lisbeth Renshaw discusses the delivery and logistics of radiotherapy, and would offer her a course of 6 1/2 weeks of treatment. We will see her back about 2 weeks after surgery to move forward with the simulation and planning process and anticipate starting radiotherapy about 4 weeks after surgery.    The above documentation reflects my direct findings during this shared patient visit. Please see the separate note by Dr. Lisbeth Renshaw on this date for the remainder of the patient's plan of care.    Carola Rhine, PAC

## 2017-04-19 NOTE — Progress Notes (Signed)
[facility] CONSULT NOTE  Patient Care Team: Alanda Amass, MD as PCP - General (Nurse Practitioner) Erroll Luna, MD as Consulting Physician (General Surgery) Nicholas Lose, MD as Consulting Physician (Hematology and Oncology) Kyung Rudd, MD as Consulting Physician (Radiation Oncology)  CHIEF COMPLAINTS/PURPOSE OF CONSULTATION:  Newly diagnosed breast cancer  HISTORY OF PRESENTING ILLNESS:  Tonya Oconnell 56 y.o. female is here because of recent diagnosis of right breast cancer. Patient had a screening mammogram the detected a right breast asymmetry and distortion at 11:30 position. By ultrasound this measured 1.3 cm. 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 The tumor was ER/PR positive HER-2 negative and a Ki-67 of 2%. She was presented this morning in the multidisciplinary tumor board and she is here today to discuss the treatment plan.  I reviewed her records extensively and collaborated the history with the patient.  SUMMARY OF ONCOLOGIC HISTORY:   Malignant neoplasm of upper-outer quadrant of right breast in female, estrogen receptor positive ([place])   04/07/2017 Initial Diagnosis    Screening detected right breast asymmetry and distortion at 11:30: 1.3 cm Right breast biopsy 11:30: Grade 1 IDC with microcalcifications ER 90%, PR 80%, Ki-67 2%, HER-2 negative ratio 1.44; T1cN0 stage IA AJCC 8      MEDICAL HISTORY:  Past Medical History:  Diagnosis Date  . Constipation   . Multiple thyroid nodules     SURGICAL HISTORY: Past Surgical History:  Procedure Laterality Date  . BREAST BIOPSY    . GANGLION CYST EXCISION     left wrist  . VAGINAL HYSTERECTOMY      SOCIAL HISTORY: Social History   Social History  . Marital status: Divorced    Spouse name: N/A  . Number of children: 1  . Years of education: N/A   Occupational History  . Admin. Assistant    Social History Main  Topics  . Smoking status: Never Smoker  . Smokeless tobacco: Never Used  . Alcohol use No  . Drug use: No  . Sexual activity: Not on file   Other Topics Concern  . Not on file   Social History Narrative  . No narrative on file    FAMILY HISTORY: Family History  Problem Relation Age of Onset  . Colon polyps Father     ALLERGIES:  is allergic to sulfa antibiotics.  MEDICATIONS:  Current Outpatient Prescriptions  Medication Sig Dispense Refill  . valACYclovir (VALTREX) 1000 MG tablet Take 1,000 mg by mouth daily.     No current facility-administered medications for this visit.     REVIEW OF SYSTEMS:   Constitutional: Denies fevers, chills or abnormal night sweats Eyes: Denies blurriness of vision, double vision or watery eyes Ears, nose, mouth, throat, and face: Denies mucositis or sore throat Respiratory: Denies cough, dyspnea or wheezes Cardiovascular: Denies palpitation, chest discomfort or lower extremity swelling Gastrointestinal:  Denies nausea, heartburn or change in bowel habits Skin: Denies abnormal skin rashes Lymphatics: Denies new lymphadenopathy or easy bruising Neurological:Denies numbness, tingling or new weaknesses Behavioral/Psych: Mood is stable, no new changes  Breast:  Denies any palpable lumps or discharge All other systems were reviewed with the patient and are negative.  PHYSICAL EXAMINATION: ECOG PERFORMANCE STATUS: 0 - Asymptomatic  Vitals:   04/19/17 1302  BP: (!) 153/88  Pulse: 82  Resp: 20  Temp: 98.1 F (36.7 C)   Filed Weights   04/19/17 1302  Weight: 157 lb (71.2 kg)  GENERAL:alert, no distress and comfortable SKIN: skin color, texture, turgor are normal, no rashes or significant lesions EYES: normal, conjunctiva are pink and non-injected, sclera clear OROPHARYNX:no exudate, no erythema and lips, buccal mucosa, and tongue normal  NECK: supple, thyroid normal size, non-tender, without nodularity LYMPH:  no palpable  lymphadenopathy in the cervical, axillary or inguinal LUNGS: clear to auscultation and percussion with normal breathing effort HEART: regular rate & rhythm and no murmurs and no lower extremity edema ABDOMEN:abdomen soft, non-tender and normal bowel sounds Musculoskeletal:no cyanosis of digits and no clubbing  PSYCH: alert & oriented x 3 with fluent speech NEURO: no focal motor/sensory deficits BREAST: No palpable nodules in breast. No palpable axillary or supraclavicular lymphadenopathy (exam performed in the presence of a chaperone)   LABORATORY DATA:  I have reviewed the data as listed Lab Results  Component Value Date   WBC 6.1 04/19/2017   HGB 13.4 04/19/2017   HCT 40.1 04/19/2017   MCV 87.7 04/19/2017   PLT 360 04/19/2017   Lab Results  Component Value Date   NA 144 04/19/2017   K 3.8 04/19/2017   CO2 28 04/19/2017    RADIOGRAPHIC STUDIES: I have personally reviewed the radiological reports and agreed with the findings in the report.  ASSESSMENT AND PLAN:  Malignant neoplasm of upper-outer quadrant of right breast in female, estrogen receptor positive ([place]) Screening detected right breast asymmetry and distortion at 11:30: 1.3 cm Right breast biopsy 11:30: Grade 1 IDC with microcalcifications ER 90%, PR 80%, Ki-67 2%, HER-2 negative ratio 1.44; T1cN0 stage IA AJCC 8  Pathology and radiology counseling:Discussed with the patient, the details of pathology including the type of breast cancer,the clinical staging, the significance of ER, PR and HER-2/neu receptors and the implications for treatment. After reviewing the pathology in detail, we proceeded to discuss the different treatment options between surgery, radiation, chemotherapy, antiestrogen therapies.  Recommendations: 1. Breast conserving surgery followed by 2. Oncotype DX testing to determine if chemotherapy would be of any benefit followed by 3. Adjuvant radiation therapy followed by 4. Adjuvant antiestrogen  therapy  Oncotype counseling: I discussed Oncotype DX test. I explained to the patient that this is a 21 gene panel to evaluate patient tumors DNA to calculate recurrence score. This would help determine whether patient has high risk or intermediate risk or low risk breast cancer. She understands that if her tumor was found to be high risk, she would benefit from systemic chemotherapy. If low risk, no need of chemotherapy. If she was found to be intermediate risk, we would need to evaluate the score as well as other risk factors and determine if an abbreviated chemotherapy may be of benefit.  Return to clinic after surgery to discuss final pathology report and then determine if Oncotype DX testing will need to be sent.   All questions were answered. The patient knows to call the clinic with any problems, questions or concerns.    Rulon Eisenmenger, MD 04/19/17

## 2017-04-19 NOTE — Progress Notes (Signed)
Nutrition Assessment  Reason for Assessment:  Pt seen in Breast Clinic  ASSESSMENT:   56 year old female with new diagnosis of right breast cancer.  Past medical reviewed.  Patient reports overall normal appetite, decreased recently due to nerves.  Medications:  reviewed  Labs: reviewed  Anthropometrics:   Height: 63 inches Weight: 157 lb BMI: 26.4   NUTRITION DIAGNOSIS: Food and nutrition related knowledge deficit related to new diagnosis of breast cancer as evidenced by no prior need for nutrition related information.  INTERVENTION:  Discussed and provided packet of information regarding nutritional tips for breast cancer patients.  Questions answered.  Teachback method used.  Contact information provided and patient knows to contact me with questions/concerns.    MONITORING, EVALUATION, and GOAL: Pt will consume a healthy plant based diet to maintain lean body mass throughout treatment.   Nyxon Strupp B. Zenia Resides, [place], [place] Registered Dietitian [phone] (pager)

## 2017-04-19 NOTE — Therapy (Signed)
[place], [place], [address] Phone: [phone]   Fax:  [phone]  Physical Therapy Evaluation  Patient Details  Name: Tonya Oconnell MRN: [id number] Date of Birth: March 29, 1961 Referring Provider: Dr. Erroll Luna  Encounter Date: 04/19/2017      PT End of Session - 04/19/17 2043    Visit Number 1   Number of Visits 1   PT Start Time 1510   PT Stop Time 1539   PT Time Calculation (min) 29 min   Activity Tolerance Patient tolerated treatment well   Behavior During Therapy [facility] for tasks assessed/performed      Past Medical History:  Diagnosis Date  . Constipation   . Multiple thyroid nodules     Past Surgical History:  Procedure Laterality Date  . BREAST BIOPSY    . GANGLION CYST EXCISION     left wrist  . VAGINAL HYSTERECTOMY      There were no vitals filed for this visit.       Subjective Assessment - 04/19/17 2031    Subjective Patient reports she is here today to be seen by her medical team for her newly diagnosed right breast cancer.   Patient is accompained by: Family member   Pertinent History Patient was diagnosed on 04/11/17 with right grade 1 invasive ductal carcinoma breast cancer. It measures 1.3 cm and is located in the upper outer quadrant. It is ER/PR positive and HEr2 negative with a Ki67 of 2%. She has no other medical problems.   Patient Stated Goals Reduce lymphedema risk and learn post op shoulder ROM HEP   Currently in Pain? No/denies            [facility] PT Assessment - 04/19/17 0001      Assessment   Medical Diagnosis Right breast cancer   Referring Provider Dr. Marcello Moores [place]   Onset Date/Surgical Date 04/11/17   Hand Dominance Right   Prior Therapy none     Precautions   Precautions Other (comment)   Precaution Comments Active breast cancer     Restrictions   Weight Bearing Restrictions No     Balance Screen   Has the patient fallen in the past 6  months No   Has the patient had a decrease in activity level because of a fear of falling?  No   Is the patient reluctant to leave their home because of a fear of falling?  No     Home Environment   Living Environment Private residence   Living Arrangements Alone   Available Help at Discharge Family     Prior Function   Level of Independence Independent   Vocation Full time employment   Vocation Requirements Receptionist; on computer most of the time   Leisure She walks 3x/week for an hour     Cognition   Overall Cognitive Status Within Functional Limits for tasks assessed     Posture/Postural Control   Posture/Postural Control Postural limitations   Postural Limitations Rounded Shoulders;Forward head     ROM / Strength   AROM / PROM / Strength AROM;Strength     AROM   AROM Assessment Site Shoulder;Cervical   Right/Left Shoulder Right;Left   Right Shoulder Extension 61 Degrees   Right Shoulder Flexion 160 Degrees   Right Shoulder ABduction 166 Degrees   Right Shoulder Internal Rotation 65 Degrees   Right Shoulder External Rotation 80 Degrees   Left Shoulder Extension 55 Degrees   Left Shoulder Flexion 155 Degrees  Left Shoulder ABduction 152 Degrees   Left Shoulder Internal Rotation 66 Degrees   Left Shoulder External Rotation 77 Degrees   Cervical Flexion WNL   Cervical Extension WNL   Cervical - Right Side Bend WNL   Cervical - Left Side Bend WNL   Cervical - Right Rotation WNL   Cervical - Left Rotation WNL     Strength   Overall Strength Within functional limits for tasks performed           LYMPHEDEMA/ONCOLOGY QUESTIONNAIRE - 04/19/17 2037      Type   Cancer Type Right breast cancer     Lymphedema Assessments   Lymphedema Assessments Upper extremities     Right Upper Extremity Lymphedema   10 cm Proximal to Olecranon Process 28.7 cm   Olecranon Process 24.8 cm   10 cm Proximal to Ulnar Styloid Process 23.3 cm   Just Proximal to Ulnar Styloid  Process 15.2 cm   Across Hand at [employer] 18.5 cm   At [place] of 2nd Digit 6.5 cm     Left Upper Extremity Lymphedema   10 cm Proximal to Olecranon Process 28.4 cm   Olecranon Process 24.5 cm   10 cm Proximal to Ulnar Styloid Process 22 cm   Just Proximal to Ulnar Styloid Process 15.4 cm   Across Hand at [employer] 18.4 cm   At [place] of 2nd Digit 6.4 cm       Patient was instructed today in a home exercise program today for post op shoulder range of motion. These included active assist shoulder flexion in sitting, scapular retraction, wall walking with shoulder abduction, and hands behind head external rotation.  She was encouraged to do these twice a day, holding 3 seconds and repeating 5 times when permitted by her physician.          PT Education - 04/19/17 2042    Education provided Yes   Education Details Lymphedema risk reduction and post op shoulder ROM HEP   Person(s) Educated Patient;Other (comment)  Aunt   Methods Explanation;Demonstration;Handout   Comprehension Verbalized understanding;Returned demonstration              Breast Clinic Goals - 04/19/17 2047      Patient will be able to verbalize understanding of pertinent lymphedema risk reduction practices relevant to her diagnosis specifically related to skin care.   Time 1   Period Days   Status Achieved     Patient will be able to return demonstrate and/or verbalize understanding of the post-op home exercise program related to regaining shoulder range of motion.   Time 1   Period Days   Status Achieved     Patient will be able to verbalize understanding of the importance of attending the postoperative After Breast Cancer Class for further lymphedema risk reduction education and therapeutic exercise.   Time 1   Period Days   Status Achieved              Plan - 04/19/17 2043    Clinical Impression Statement Patient was diagnosed on 04/11/17 with right grade 1 invasive ductal  carcinoma breast cancer. It measures 1.3 cm and is located in the upper outer quadrant. It is ER/PR positive and HEr2 negative with a Ki67 of 2%. She has no other medical problems. Her medical team met prior to her assessments to determine a recommended treatment plan. She is planning to have a right lumpectomy, Oncotype testing, radiation, and anti-estrogen therapy. She may  benefit from post op PT to regain shoulder ROM and reduce lymphedema risk. Due to her lack of comorbidities, her eval is of low complexity.   Rehab Potential Excellent   Clinical Impairments Affecting Rehab Potential none   PT Frequency One time visit   PT Treatment/Interventions Patient/family education;Therapeutic exercise   PT Next Visit Plan Pt will f/u after surgery to determine PT needs   PT Home Exercise Plan Post op shoulder ROM HEP   Consulted and Agree with Plan of Care Patient;Family member/caregiver   Family Member Consulted Aunt      Patient will benefit from skilled therapeutic intervention in order to improve the following deficits and impairments:  Postural dysfunction, Decreased knowledge of precautions, Pain, Impaired UE functional use, Decreased range of motion  Visit Diagnosis: Carcinoma of upper-outer quadrant of right breast in female, estrogen receptor positive ([place]) - Plan: PT plan of care cert/re-cert  Abnormal posture - Plan: PT plan of care cert/re-cert   Patient will follow up at outpatient cancer rehab if needed following surgery.  If the patient requires physical therapy at that time, a specific plan will be dictated and sent to the referring physician for approval. The patient was educated today on appropriate basic range of motion exercises to begin post operatively and the importance of attending the After Breast Cancer class following surgery.  Patient was educated today on lymphedema risk reduction practices as it pertains to recommendations that will benefit the patient immediately  following surgery.  She verbalized good understanding.  No additional physical therapy is indicated at this time.     Problem List Patient Active Problem List   Diagnosis Date Noted  . Malignant neoplasm of upper-outer quadrant of right breast in female, estrogen receptor positive ([place]) 04/17/2017  . Chronic idiopathic constipation 08/06/2012    Annia Friendly, PT 04/19/17 8:50 PM  [place], [place], [address] Phone: [phone]   Fax:  [phone]  Name: Tonya Oconnell MRN: [id number] Date of Birth: [birth year]

## 2017-04-19 NOTE — Assessment & Plan Note (Signed)
Screening detected right breast asymmetry and distortion at 11:30: 1.3 cm Right breast biopsy 11:30: Grade 1 IDC with microcalcifications ER 90%, PR 80%, Ki-67 2%, HER-2 negative ratio 1.44; T1cN0 stage IA AJCC 8  Pathology and radiology counseling:Discussed with the patient, the details of pathology including the type of breast cancer,the clinical staging, the significance of ER, PR and HER-2/neu receptors and the implications for treatment. After reviewing the pathology in detail, we proceeded to discuss the different treatment options between surgery, radiation, chemotherapy, antiestrogen therapies.  Recommendations: 1. Breast conserving surgery followed by 2. Oncotype DX testing to determine if chemotherapy would be of any benefit followed by 3. Adjuvant radiation therapy followed by 4. Adjuvant antiestrogen therapy  Oncotype counseling: I discussed Oncotype DX test. I explained to the patient that this is a 21 gene panel to evaluate patient tumors DNA to calculate recurrence score. This would help determine whether patient has high risk or intermediate risk or low risk breast cancer. She understands that if her tumor was found to be high risk, she would benefit from systemic chemotherapy. If low risk, no need of chemotherapy. If she was found to be intermediate risk, we would need to evaluate the score as well as other risk factors and determine if an abbreviated chemotherapy may be of benefit.  Return to clinic after surgery to discuss final pathology report and then determine if Oncotype DX testing will need to be sent.

## 2017-04-20 ENCOUNTER — Encounter: Payer: Self-pay | Admitting: General Practice

## 2017-04-20 NOTE — Progress Notes (Signed)
[facility] Psychosocial Distress Screening Spiritual Care  Reached Jesilyn by phone following Breast Multidisciplinary Clinic to introduce [place] team/resources, reviewing distress screen per protocol.  The patient scored a 8 on the Psychosocial Distress Thermometer which indicates severe distress. Also assessed for distress and other psychosocial needs.   ONCBCN DISTRESS SCREENING 04/20/2017  Screening Type Initial Screening  Distress experienced in past week (1-10) 8  Practical problem type Work/school  Emotional problem type Nervousness/Anxiety  Information Concerns Type Lack of info about diagnosis  Referral to support programs Yes   Ms Fix reports good support from three people who have/had, or have cared for people with, cancer. Having this familiarity and emotional support helps normalize feelings in a trusted environment. She plans to review clinic materials this weekend and knows to look through the [employer]. We discussed team members and programs offered.  Follow up needed: No. At this time, Ms Wooton notes that "talking to people actually makes me more nervous"; she prefers to take processing at her own pace and then to reach out as desired after she has begun treatment plan.   [place], [place], [facility] Pager [phone] Voicemail [phone]

## 2017-04-24 ENCOUNTER — Telehealth: Payer: Self-pay | Admitting: *Deleted

## 2017-04-24 NOTE — Telephone Encounter (Signed)
Spoke to pt concerning [place] from 04/19/17. Denies questions or concerns regarding dx or treatment care plan. Encourage pt to call with needs. Received verbal understanding. Contact information provided.

## 2017-04-25 ENCOUNTER — Telehealth: Payer: Self-pay | Admitting: Radiation Oncology

## 2017-04-25 DIAGNOSIS — Z170 Estrogen receptor positive status [ER+]: Principal | ICD-10-CM

## 2017-04-25 DIAGNOSIS — C50411 Malignant neoplasm of upper-outer quadrant of right female breast: Secondary | ICD-10-CM

## 2017-04-25 NOTE — Telephone Encounter (Signed)
I spoke with the patient, she is interested in receiving radiation in [place]. We will refer her to Radiation Oncology there.

## 2017-05-05 ENCOUNTER — Other Ambulatory Visit: Payer: Self-pay | Admitting: Surgery

## 2017-05-05 DIAGNOSIS — Z170 Estrogen receptor positive status [ER+]: Principal | ICD-10-CM

## 2017-05-05 DIAGNOSIS — C50411 Malignant neoplasm of upper-outer quadrant of right female breast: Secondary | ICD-10-CM

## 2017-05-08 ENCOUNTER — Ambulatory Visit: Payer: Self-pay | Admitting: Surgery

## 2017-05-09 ENCOUNTER — Encounter (HOSPITAL_BASED_OUTPATIENT_CLINIC_OR_DEPARTMENT_OTHER): Payer: Self-pay | Admitting: *Deleted

## 2017-05-09 NOTE — Pre-Procedure Instructions (Signed)
To come pick up Boost Breeze 8 oz. - to drink by 0730 DOS

## 2017-05-12 ENCOUNTER — Ambulatory Visit
Admission: RE | Admit: 2017-05-12 | Discharge: 2017-05-12 | Disposition: A | Discharge status: Home / Self Care | Payer: BLUE CROSS/BLUE SHIELD | Source: Ambulatory Visit | Attending: Surgery | Admitting: Surgery

## 2017-05-12 DIAGNOSIS — Z170 Estrogen receptor positive status [ER+]: Principal | ICD-10-CM

## 2017-05-12 DIAGNOSIS — C50411 Malignant neoplasm of upper-outer quadrant of right female breast: Secondary | ICD-10-CM

## 2017-05-12 NOTE — Progress Notes (Signed)
Boost drink given with instructions to complete by [place], pt verbalized understanding.

## 2017-05-17 ENCOUNTER — Encounter (HOSPITAL_COMMUNITY)
Admission: RE | Admit: 2017-05-17 | Discharge: 2017-05-17 | Disposition: A | Discharge status: Home / Self Care | Payer: BLUE CROSS/BLUE SHIELD | Source: Ambulatory Visit | Attending: Surgery | Admitting: Surgery

## 2017-05-17 ENCOUNTER — Encounter (HOSPITAL_BASED_OUTPATIENT_CLINIC_OR_DEPARTMENT_OTHER): Payer: Self-pay | Admitting: Anesthesiology

## 2017-05-17 ENCOUNTER — Ambulatory Visit (HOSPITAL_BASED_OUTPATIENT_CLINIC_OR_DEPARTMENT_OTHER)
Admission: RE | Admit: 2017-05-17 | Discharge: 2017-05-17 | Disposition: A | Discharge status: Home / Self Care | Payer: BLUE CROSS/BLUE SHIELD | Source: Ambulatory Visit | Attending: Surgery | Admitting: Surgery

## 2017-05-17 ENCOUNTER — Encounter (HOSPITAL_BASED_OUTPATIENT_CLINIC_OR_DEPARTMENT_OTHER)
Admission: RE | Disposition: A | Discharge status: Home / Self Care | Payer: Self-pay | Source: Ambulatory Visit | Attending: Surgery

## 2017-05-17 ENCOUNTER — Ambulatory Visit (HOSPITAL_BASED_OUTPATIENT_CLINIC_OR_DEPARTMENT_OTHER): Payer: BLUE CROSS/BLUE SHIELD | Admitting: Anesthesiology

## 2017-05-17 ENCOUNTER — Ambulatory Visit
Admission: RE | Admit: 2017-05-17 | Discharge: 2017-05-17 | Disposition: A | Discharge status: Home / Self Care | Payer: BLUE CROSS/BLUE SHIELD | Source: Ambulatory Visit | Attending: Surgery | Admitting: Surgery

## 2017-05-17 DIAGNOSIS — Z170 Estrogen receptor positive status [ER+]: Principal | ICD-10-CM

## 2017-05-17 DIAGNOSIS — C50411 Malignant neoplasm of upper-outer quadrant of right female breast: Secondary | ICD-10-CM

## 2017-05-17 HISTORY — DX: Pain disorder exclusively related to psychological factors: F45.41

## 2017-05-17 HISTORY — DX: Malignant neoplasm of unspecified site of unspecified female breast: C50.919

## 2017-05-17 HISTORY — PX: RADIOACTIVE SEED GUIDED PARTIAL MASTECTOMY WITH AXILLARY SENTINEL LYMPH NODE BIOPSY: SHX6520

## 2017-05-17 SURGERY — RADIOACTIVE SEED GUIDED PARTIAL MASTECTOMY WITH AXILLARY SENTINEL LYMPH NODE BIOPSY
Anesthesia: General | Site: Breast | Laterality: Right

## 2017-05-17 MED ORDER — MIDAZOLAM HCL 2 MG/2ML IJ SOLN
INTRAMUSCULAR | Status: AC
  Filled 2017-05-17: qty 2

## 2017-05-17 MED ORDER — FENTANYL CITRATE (PF) 100 MCG/2ML IJ SOLN
INTRAMUSCULAR | Status: AC
  Filled 2017-05-17: qty 2

## 2017-05-17 MED ORDER — SCOPOLAMINE 1 MG/3DAYS TD PT72: 1.0000 | MEDICATED_PATCH | Freq: Once | TRANSDERMAL | Status: DC | PRN

## 2017-05-17 MED ORDER — DEXTROSE 5 % IV SOLN
3.0000 g | INTRAVENOUS | Status: AC
  Administered 2017-05-17: 2 g via INTRAVENOUS

## 2017-05-17 MED ORDER — METHYLENE BLUE 0.5 % INJ SOLN
INTRAVENOUS | Status: DC | PRN
  Administered 2017-05-17: 5 mL via INTRAMUSCULAR

## 2017-05-17 MED ORDER — LIDOCAINE 2% (20 MG/ML) 5 ML SYRINGE
INTRAMUSCULAR | Status: AC
  Filled 2017-05-17: qty 5

## 2017-05-17 MED ORDER — LACTATED RINGERS IV SOLN
INTRAVENOUS | Status: DC
  Administered 2017-05-17 (×2): via INTRAVENOUS

## 2017-05-17 MED ORDER — CHLORHEXIDINE GLUCONATE CLOTH 2 % EX PADS: 6.0000 | MEDICATED_PAD | Freq: Once | CUTANEOUS | Status: DC

## 2017-05-17 MED ORDER — MIDAZOLAM HCL 2 MG/2ML IJ SOLN: 0.5000 mg | Freq: Once | INTRAMUSCULAR | Status: DC | PRN

## 2017-05-17 MED ORDER — FENTANYL CITRATE (PF) 100 MCG/2ML IJ SOLN
INTRAMUSCULAR | Status: DC | PRN
  Administered 2017-05-17: 100 ug via INTRAVENOUS

## 2017-05-17 MED ORDER — OXYCODONE HCL 5 MG PO TABS: 5.0000 mg | ORAL_TABLET | Freq: Four times a day (QID) | ORAL | 0 refills | Status: DC | PRN

## 2017-05-17 MED ORDER — GABAPENTIN 300 MG PO CAPS
300.0000 mg | ORAL_CAPSULE | ORAL | Status: AC
  Administered 2017-05-17: 300 mg via ORAL

## 2017-05-17 MED ORDER — EPHEDRINE 5 MG/ML INJ
INTRAVENOUS | Status: AC
  Filled 2017-05-17: qty 10

## 2017-05-17 MED ORDER — EPHEDRINE SULFATE 50 MG/ML IJ SOLN
INTRAMUSCULAR | Status: DC | PRN
  Administered 2017-05-17: 10 mg via INTRAVENOUS

## 2017-05-17 MED ORDER — CEFAZOLIN SODIUM-DEXTROSE 2-4 GM/100ML-% IV SOLN
INTRAVENOUS | Status: AC
  Filled 2017-05-17: qty 100

## 2017-05-17 MED ORDER — ONDANSETRON HCL 4 MG/2ML IJ SOLN
INTRAMUSCULAR | Status: DC | PRN
  Administered 2017-05-17: 4 mg via INTRAVENOUS

## 2017-05-17 MED ORDER — BUPIVACAINE-EPINEPHRINE (PF) 0.5% -1:200000 IJ SOLN
INTRAMUSCULAR | Status: DC | PRN
  Administered 2017-05-17: 30 mL

## 2017-05-17 MED ORDER — PHENYLEPHRINE HCL 10 MG/ML IJ SOLN
INTRAMUSCULAR | Status: DC | PRN
  Administered 2017-05-17: 80 ug via INTRAVENOUS

## 2017-05-17 MED ORDER — PROMETHAZINE HCL 25 MG/ML IJ SOLN: 6.2500 mg | INTRAMUSCULAR | Status: DC | PRN

## 2017-05-17 MED ORDER — PHENYLEPHRINE 40 MCG/ML (10ML) SYRINGE FOR IV PUSH (FOR BLOOD PRESSURE SUPPORT)
PREFILLED_SYRINGE | INTRAVENOUS | Status: AC
  Filled 2017-05-17: qty 10

## 2017-05-17 MED ORDER — ACETAMINOPHEN 500 MG PO TABS
ORAL_TABLET | ORAL | Status: AC
  Filled 2017-05-17: qty 2

## 2017-05-17 MED ORDER — BUPIVACAINE-EPINEPHRINE (PF) 0.25% -1:200000 IJ SOLN
INTRAMUSCULAR | Status: DC | PRN
  Administered 2017-05-17: 12 mL

## 2017-05-17 MED ORDER — PROPOFOL 10 MG/ML IV BOLUS
INTRAVENOUS | Status: AC
  Filled 2017-05-17: qty 20

## 2017-05-17 MED ORDER — ACETAMINOPHEN 500 MG PO TABS
1000.0000 mg | ORAL_TABLET | ORAL | Status: AC
  Administered 2017-05-17: 1000 mg via ORAL

## 2017-05-17 MED ORDER — DEXAMETHASONE SODIUM PHOSPHATE 4 MG/ML IJ SOLN
INTRAMUSCULAR | Status: DC | PRN
  Administered 2017-05-17: 10 mg via INTRAVENOUS

## 2017-05-17 MED ORDER — MIDAZOLAM HCL 5 MG/5ML IJ SOLN
INTRAMUSCULAR | Status: DC | PRN
  Administered 2017-05-17: 2 mg via INTRAVENOUS

## 2017-05-17 MED ORDER — GABAPENTIN 300 MG PO CAPS
ORAL_CAPSULE | ORAL | Status: AC
  Filled 2017-05-17: qty 1

## 2017-05-17 MED ORDER — PROMETHAZINE HCL 12.5 MG PO TABS: 12.5000 mg | ORAL_TABLET | Freq: Four times a day (QID) | ORAL | 0 refills | Status: DC | PRN

## 2017-05-17 MED ORDER — PROPOFOL 10 MG/ML IV BOLUS
INTRAVENOUS | Status: DC | PRN
  Administered 2017-05-17: 150 mg via INTRAVENOUS

## 2017-05-17 MED ORDER — LIDOCAINE HCL (CARDIAC) 20 MG/ML IV SOLN
INTRAVENOUS | Status: DC | PRN
  Administered 2017-05-17: 30 mg via INTRAVENOUS

## 2017-05-17 MED ORDER — METOPROLOL TARTRATE 5 MG/5ML IV SOLN
INTRAVENOUS | Status: AC
  Filled 2017-05-17: qty 5

## 2017-05-17 MED ORDER — FENTANYL CITRATE (PF) 100 MCG/2ML IJ SOLN
50.0000 ug | INTRAMUSCULAR | Status: DC | PRN
  Administered 2017-05-17 (×2): 50 ug via INTRAVENOUS

## 2017-05-17 MED ORDER — TECHNETIUM TC 99M SULFUR COLLOID FILTERED
1.0000 | Freq: Once | INTRAVENOUS | Status: AC | PRN
  Administered 2017-05-17: 1 via INTRADERMAL

## 2017-05-17 MED ORDER — FENTANYL CITRATE (PF) 100 MCG/2ML IJ SOLN: 25.0000 ug | INTRAMUSCULAR | Status: DC | PRN

## 2017-05-17 MED ORDER — MIDAZOLAM HCL 2 MG/2ML IJ SOLN
1.0000 mg | INTRAMUSCULAR | Status: DC | PRN
  Administered 2017-05-17 (×2): 1 mg via INTRAVENOUS

## 2017-05-17 MED ORDER — DEXAMETHASONE SODIUM PHOSPHATE 10 MG/ML IJ SOLN
INTRAMUSCULAR | Status: AC
  Filled 2017-05-17: qty 1

## 2017-05-17 MED ORDER — ONDANSETRON HCL 4 MG/2ML IJ SOLN
INTRAMUSCULAR | Status: AC
  Filled 2017-05-17: qty 2

## 2017-05-17 MED ORDER — MEPERIDINE HCL 25 MG/ML IJ SOLN: 6.2500 mg | INTRAMUSCULAR | Status: DC | PRN

## 2017-05-17 SURGICAL SUPPLY — 47 items
APPLIER CLIP 9.375 MED OPEN (MISCELLANEOUS) ×3
BINDER BREAST LRG (GAUZE/BANDAGES/DRESSINGS) ×3 IMPLANT
BINDER BREAST MEDIUM (GAUZE/BANDAGES/DRESSINGS) IMPLANT
BINDER BREAST XLRG (GAUZE/BANDAGES/DRESSINGS) IMPLANT
BINDER BREAST XXLRG (GAUZE/BANDAGES/DRESSINGS) IMPLANT
BLADE SURG 15 STRL LF DISP TIS (BLADE) ×1 IMPLANT
BLADE SURG 15 STRL SS (BLADE) ×2
CANISTER SUC SOCK COL 7IN (MISCELLANEOUS) IMPLANT
CANISTER SUCT 1200ML W/VALVE (MISCELLANEOUS) ×3 IMPLANT
CHLORAPREP W/TINT 26ML (MISCELLANEOUS) ×3 IMPLANT
CLIP APPLIE 9.375 MED OPEN (MISCELLANEOUS) ×1 IMPLANT
COVER BACK TABLE 60X90IN (DRAPES) ×3 IMPLANT
COVER MAYO STAND STRL (DRAPES) ×3 IMPLANT
COVER PROBE W GEL 5X96 (DRAPES) ×3 IMPLANT
DECANTER SPIKE VIAL GLASS SM (MISCELLANEOUS) IMPLANT
DERMABOND ADVANCED (GAUZE/BANDAGES/DRESSINGS) ×2
DERMABOND ADVANCED .7 DNX12 (GAUZE/BANDAGES/DRESSINGS) ×1 IMPLANT
DEVICE DUBIN W/COMP PLATE 8390 (MISCELLANEOUS) ×3 IMPLANT
DRAPE LAPAROSCOPIC ABDOMINAL (DRAPES) ×3 IMPLANT
DRAPE UTILITY XL STRL (DRAPES) ×3 IMPLANT
ELECT COATED BLADE 2.86 ST (ELECTRODE) ×3 IMPLANT
ELECT REM PT RETURN 9FT ADLT (ELECTROSURGICAL) ×3
ELECTRODE REM PT RTRN 9FT ADLT (ELECTROSURGICAL) ×1 IMPLANT
GLOVE BIOGEL PI IND STRL 8 (GLOVE) ×1 IMPLANT
GLOVE BIOGEL PI INDICATOR 8 (GLOVE) ×2
GLOVE ECLIPSE 8.0 STRL XLNG CF (GLOVE) ×3 IMPLANT
GOWN STRL REUS W/ TWL LRG LVL3 (GOWN DISPOSABLE) ×2 IMPLANT
GOWN STRL REUS W/TWL LRG LVL3 (GOWN DISPOSABLE) ×4
HEMOSTAT ARISTA ABSORB 3G PWDR (MISCELLANEOUS) ×3 IMPLANT
HEMOSTAT SNOW SURGICEL 2X4 (HEMOSTASIS) IMPLANT
KIT MARKER MARGIN INK (KITS) ×3 IMPLANT
NDL SAFETY ECLIPSE 18X1.5 (NEEDLE) IMPLANT
NEEDLE HYPO 18GX1.5 SHARP (NEEDLE)
NEEDLE HYPO 25X1 1.5 SAFETY (NEEDLE) ×3 IMPLANT
NS IRRIG 1000ML POUR BTL (IV SOLUTION) ×3 IMPLANT
PACK BASIN DAY SURGERY FS (CUSTOM PROCEDURE TRAY) ×3 IMPLANT
PENCIL BUTTON HOLSTER BLD 10FT (ELECTRODE) ×3 IMPLANT
SLEEVE SCD COMPRESS KNEE MED (MISCELLANEOUS) ×3 IMPLANT
SPONGE LAP 4X18 X RAY DECT (DISPOSABLE) ×3 IMPLANT
SUT MNCRL AB 4-0 PS2 18 (SUTURE) ×3 IMPLANT
SUT VICRYL 3-0 CR8 SH (SUTURE) ×3 IMPLANT
SYR CONTROL 10ML LL (SYRINGE) ×3 IMPLANT
TOWEL OR 17X24 6PK STRL BLUE (TOWEL DISPOSABLE) ×3 IMPLANT
TOWEL OR NON WOVEN STRL DISP B (DISPOSABLE) ×3 IMPLANT
TUBE CONNECTING 20'X1/4 (TUBING) ×1
TUBE CONNECTING 20X1/4 (TUBING) ×2 IMPLANT
YANKAUER SUCT BULB TIP NO VENT (SUCTIONS) ×6 IMPLANT

## 2017-05-17 NOTE — Anesthesia Preprocedure Evaluation (Addendum)
Anesthesia Evaluation  Patient identified by MRN, date of birth, ID band Patient awake    Reviewed: Allergy & Precautions, NPO status , Patient's Chart, lab work & pertinent test results  History of Anesthesia Complications Negative for: history of anesthetic complications  Airway Mallampati: II  TM Distance: >3 FB Neck ROM: Full    Dental  (+) Dental Advisory Given   Pulmonary neg pulmonary ROS,    breath sounds clear to auscultation       Cardiovascular negative cardio ROS   Rhythm:Regular Rate:Normal     Neuro/Psych negative neurological ROS     GI/Hepatic negative GI ROS, Neg liver ROS,   Endo/Other  negative endocrine ROS  Renal/GU negative Renal ROS     Musculoskeletal negative musculoskeletal ROS (+)   Abdominal   Peds  Hematology negative hematology ROS (+)   Anesthesia Other Findings Breast cancer  Reproductive/Obstetrics negative OB ROS                            Anesthesia Physical Anesthesia Plan  ASA: I  Anesthesia Plan: General   Post-op Pain Management: GA combined w/ Regional for post-op pain   Induction: Intravenous  Airway Management Planned: LMA  Additional Equipment:   Intra-op Plan:   Post-operative Plan:   Informed Consent: I have reviewed the patients History and Physical, chart, labs and discussed the procedure including the risks, benefits and alternatives for the proposed anesthesia with the patient or authorized representative who has indicated his/her understanding and acceptance.   Dental advisory given  Plan Discussed with: CRNA and Surgeon  Anesthesia Plan Comments: (Plan routine monitors, GA- LMA OK, pectoralis block for post op analgesia)        Anesthesia Quick Evaluation

## 2017-05-17 NOTE — Anesthesia Procedure Notes (Signed)
Anesthesia Regional Block: Pectoralis block   Pre-Anesthetic Checklist: ,, timeout performed, Correct Patient, Correct Site, Correct Laterality, Correct Procedure, Correct Position, site marked, Risks and benefits discussed,  Surgical consent,  Pre-op evaluation,  At surgeon's request and post-op pain management  Laterality: Right  Prep: chloraprep       Needles:  Injection technique: Single-shot  Needle Type: Echogenic Needle     Needle Length: 9cm  Needle Gauge: 21     Additional Needles:   Procedures: ultrasound guided,,,,,,,,  Narrative:  Start time: 05/17/2017 10:46 AM End time: 05/17/2017 10:54 AM Injection made incrementally with aspirations every 5 mL.  Performed by: Personally  Anesthesiologist: Glennon Mac, Oluwatosin Bracy  Additional Notes: Pt identified in Holding room.  Monitors applied. Working IV access confirmed. Sterile prep, drape R clavicle and pec.  #21ga ECHOgenic needle between pec and serratus, between ribs 4,5 with US guidance.  30cc 0.5% Bupivacaine with 1:200k epi injected incrementally after negative test dose, good spread of local across ribs.  Patient asymptomatic, VSS, no heme aspirated, tolerated well.  Jenita Seashore, MD

## 2017-05-17 NOTE — Discharge Instructions (Signed)
[place] Office Phone Number [phone]  BREAST BIOPSY/ Lumpectomy: POST OP INSTRUCTIONS  Always review your discharge instruction sheet given to you by the facility where your surgery was performed.  IF YOU HAVE DISABILITY OR FAMILY LEAVE FORMS, YOU MUST BRING THEM TO THE OFFICE FOR PROCESSING.  DO NOT GIVE THEM TO YOUR DOCTOR.  1. A prescription for pain medication may be given to you upon discharge.  Take your pain medication as prescribed, if needed.  If narcotic pain medicine is not needed, then you may take acetaminophen (Tylenol) or ibuprofen (Advil) as needed. 2. Take your usually prescribed medications unless otherwise directed 3. If you need a refill on your pain medication, please contact your pharmacy.  They will contact our office to request authorization.  Prescriptions will not be filled after 5pm or on week-ends. 4. You should eat very light the first 24 hours after surgery, such as soup, crackers, pudding, etc.  Resume your normal diet the day after surgery. 5. Most patients will experience some swelling and bruising in the breast.  Ice packs and a good support bra will help.  Swelling and bruising can take several days to resolve.  6. It is common to experience some constipation if taking pain medication after surgery.  Increasing fluid intake and taking a stool softener will usually help or prevent this problem from occurring.  A mild laxative (Milk of Magnesia or Miralax) should be taken according to package directions if there are no bowel movements after 48 hours. 7. Unless discharge instructions indicate otherwise, you may remove your bandages 24-48 hours after surgery, and you may shower at that time.  You may have steri-strips (small skin tapes) in place directly over the incision.  These strips should be left on the skin for 7-10 days.  If your surgeon used skin glue on the incision, you may shower in 24 hours.  The glue will flake off over the next 2-3  weeks.  Any sutures or staples will be removed at the office during your follow-up visit. 8. ACTIVITIES:  You may resume regular daily activities (gradually increasing) beginning the next day.  Wearing a good support bra or sports bra minimizes pain and swelling.  You may have sexual intercourse when it is comfortable. a. You may drive when you no longer are taking prescription pain medication, you can comfortably wear a seatbelt, and you can safely maneuver your car and apply brakes. b. RETURN TO WORK:     2 WEEKS  ______________________________________________________________________________________ 9. You should see your doctor in the office for a follow-up appointment approximately two weeks after your surgery.  Your doctors nurse will typically make your follow-up appointment when she calls you with your pathology report.  Expect your pathology report 2-3 business days after your surgery.  You may call to check if you do not hear from [place] after three days. 10. OTHER INSTRUCTIONS: _______________________________________________________________________________________________ _____________________________________________________________________________________________________________________________________ _____________________________________________________________________________________________________________________________________ _____________________________________________________________________________________________________________________________________  WHEN TO CALL YOUR DOCTOR: 1. Fever over 101.0 2. Nausea and/or vomiting. 3. Extreme swelling or bruising. 4. Continued bleeding from incision. 5. Increased pain, redness, or drainage from the incision.  The clinic staff is available to answer your questions during regular business hours.  Please dont hesitate to call and ask to speak to one of the nurses for clinical concerns.  If you have a medical emergency, go to the nearest  emergency room or call 911.  A surgeon from [facility] is always on call at the hospital.  For further  questions, please visit [email or web address]    Post Anesthesia Home Care Instructions  Activity: Get plenty of rest for the remainder of the day. A responsible individual must stay with you for 24 hours following the procedure.  For the next 24 hours, DO NOT: -Drive a car -Paediatric nurse -Drink alcoholic beverages -Take any medication unless instructed by your physician -Make any legal decisions or sign important papers.  Meals: Start with liquid foods such as gelatin or soup. Progress to regular foods as tolerated. Avoid greasy, spicy, heavy foods. If nausea and/or vomiting occur, drink only clear liquids until the nausea and/or vomiting subsides. Call your physician if vomiting continues.  Special Instructions/Symptoms: Your throat may feel dry or sore from the anesthesia or the breathing tube placed in your throat during surgery. If this causes discomfort, gargle with warm salt water. The discomfort should disappear within 24 hours.  If you had a scopolamine patch placed behind your ear for the management of post- operative nausea and/or vomiting:  1. The medication in the patch is effective for 72 hours, after which it should be removed.  Wrap patch in a tissue and discard in the trash. Wash hands thoroughly with soap and water. 2. You may remove the patch earlier than 72 hours if you experience unpleasant side effects which may include dry mouth, dizziness or visual disturbances. 3. Avoid touching the patch. Wash your hands with soap and water after contact with the patch.

## 2017-05-17 NOTE — Anesthesia Postprocedure Evaluation (Signed)
Anesthesia Post Note  Patient: Psychologist, clinical  Procedure(s) Performed: Procedure(s) (LRB): RIGHT BREAST RADIOACTIVE SEED GUIDED PARTIAL MASTECTOMY WITH SENTINEL LYMPH NODE MAPPING (Right)  Patient location during evaluation: PACU Anesthesia Type: General Level of consciousness: awake and alert Pain management: pain level controlled Vital Signs Assessment: post-procedure vital signs reviewed and stable Respiratory status: spontaneous breathing, nonlabored ventilation, respiratory function stable and patient connected to nasal cannula oxygen Cardiovascular status: blood pressure returned to baseline and stable Postop Assessment: no signs of nausea or vomiting Anesthetic complications: no       Last Vitals:  Vitals:   05/17/17 1330 05/17/17 1350  BP: 133/90 130/81  Pulse: 81 80  Resp: 14 16  Temp:  36.6 C    Last Pain:  Vitals:   05/17/17 1350  TempSrc:   PainSc: [place]

## 2017-05-17 NOTE — Interval H&P Note (Signed)
History and Physical Interval Note:  05/17/2017 11:13 AM  [name]  has presented today for surgery, with the diagnosis of RIGHT BREAST CANCER  The various methods of treatment have been discussed with the patient and family. After consideration of risks, benefits and other options for treatment, the patient has consented to  Procedure(s): RIGHT BREAST RADIOACTIVE SEED GUIDED PARTIAL MASTECTOMY WITH SENTINEL [name] as a surgical intervention .  The patient's history has been reviewed, patient examined, no change in status, stable for surgery.  I have reviewed the patient's chart and labs.  Questions were answered to the patient's satisfaction.     Tonya Oconnell A.

## 2017-05-17 NOTE — Op Note (Signed)
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   Anesthesia: LMA with pectoral block anesthesia  And local   EBL: 20 cc   Specimen: right  breast mass with clip and seed to pathology and one axillary sentinel node hot and blue    Drains: None   Indications for procedure: Patient presents for treatment of her right  breast cancer. She has opted for breast conservation after lengthy discussion of treatment options to include breast conservation surgery and mastectomy and reconstruction. Risks, benefits and alternatives discussed with the patient.The procedure has been discussed with the patient. Alternatives to surgery have been discussed with the patient. Risks of surgery include bleeding, Infection, Seroma formation, death, and the need for further surgery. The patient understands and wishes to proceed.Sentinel lymph node mapping and dissection has been discussed with the patient. Risk of bleeding, Infection, Seroma formation, Additional procedures,, Shoulder weakness , Shoulder stiffness, Nerve and blood vessel injury and reaction to the mapping dyes have been discussed. Alternatives to surgery have been discussed with the patient. The patient agrees to proceed.   Description of procedure: Patient underwent placement of right  breast seed at radiology earlier in the week. She presents to the holding area and questions are answered. Neoprobe was used to verify clip placement in the left breast. Patient underwent technetium sulfur colloid injection per protocol. Questions answered. Patient taken back to operating room and placed supine on the operating room table. Patient received 2 g of Ancef. After induction of LMA anesthesia right  breast was prepped and draped in a sterile  fashion and 4 cc of methylene blue dye were injected in a subareolar position. Of note, patient had pectoral block by anesthesia prior to this. Neoprobe was used to identify the radioactive seen in the left upper-outer quadrant. Curvilinear incision made around the nipple  and dissection was carried around to excise all tissue around both the clip and seed. Radiograph showed the mass with gross negative margins. Both seed and clip were in the specimen. Specimen sent to pathology.   Neoprobe was switched to the technetium sulfur colloid setting. Hot spot identify the right  axilla. Incision made in the inferior axillary hairline and dissection carried into the axilla.  Two Hot and blue lymph node identified and excised. Background counts approached 0. Wound  was irrigated and closed with 3-0 Vicryl and 4-0 Monocryl. Lumpectomy site closed in a similar fashion. Dermabond applied. All final counts sponge, needle instruments found to be correct at this point. Patient awoke, taken to recovery in satisfactory condition.

## 2017-05-17 NOTE — Transfer of Care (Signed)
Immediate Anesthesia Transfer of Care Note  Patient: Tonya Oconnell  Procedure(s) Performed: Procedure(s): RIGHT BREAST RADIOACTIVE SEED GUIDED PARTIAL MASTECTOMY WITH SENTINEL LYMPH NODE MAPPING (Right)  Patient Location: PACU  Anesthesia Type:GA combined with regional for post-op pain  Level of Consciousness: awake, sedated and patient cooperative  Airway & Oxygen Therapy: Patient Spontanous Breathing and Patient connected to face mask oxygen  Post-op Assessment: Report given to RN and Post -op Vital signs reviewed and stable  Post vital signs: Reviewed and stable  Last Vitals:  Vitals:   05/17/17 1100 05/17/17 1105  BP: 128/72 108/68  Pulse: 68 64  Resp: 12 (!) 9  Temp:      Last Pain:  Vitals:   05/17/17 0958  TempSrc: Oral         Complications: No apparent anesthesia complications

## 2017-05-17 NOTE — Progress Notes (Signed)
Nuc med staff performed nuc med injection. Pt tol well with additional sedation for comfort. VSS (see flowsheet).

## 2017-05-17 NOTE — Anesthesia Procedure Notes (Signed)
Procedure Name: LMA Insertion Date/Time: 05/17/2017 11:32 AM Performed by: Toula Moos L Pre-anesthesia Checklist: Patient identified, Emergency Drugs available, Suction available, Patient being monitored and Timeout performed Patient Re-evaluated:Patient Re-evaluated prior to inductionOxygen Delivery Method: Circle system utilized Preoxygenation: Pre-oxygenation with 100% oxygen Intubation Type: IV induction Ventilation: Mask ventilation without difficulty LMA: LMA inserted LMA Size: 4.0 Number of attempts: 1 Airway Equipment and Method: Bite block Placement Confirmation: positive ETCO2 Tube secured with: Tape Dental Injury: Teeth and Oropharynx as per pre-operative assessment

## 2017-05-17 NOTE — H&P (View-Only) (Signed)
Tonya Oconnell 04/19/2017 7:49 AM Location: [facility] Patient #: [id number] DOB: 1961/09/16 Undefined / Language: Undefined / Race: Refused to Report/Unreported Female  History of Present Illness (Thomas A. Cornett MD; 04/19/2017 3:03 PM) Patient words: Pt sent at the request of Dr Lisbeth Renshaw for right breast cancer. Pt had an abnormal mammogram. A 1.3 cm mass right breast upper outer quadrant was noted and core bx showed IDC ER+ PR + HER 2 NEU NEGATIVE. No hx of breast mass pain or nipple discharge              ADDITIONAL INFORMATION: PROGNOSTIC INDICATORS Results: IMMUNOHISTOCHEMICAL AND MORPHOMETRIC ANALYSIS PERFORMED MANUALLY Estrogen Receptor: 90%, POSITIVE, STRONG STAINING INTENSITY Progesterone Receptor: 80%, POSITIVE, STRONG STAINING INTENSITY Proliferation Marker Ki67: 2% REFERENCE RANGE ESTROGEN RECEPTOR NEGATIVE 0% POSITIVE =>1% REFERENCE RANGE PROGESTERONE RECEPTOR NEGATIVE 0% POSITIVE =>1% All controls stained appropriately Claudette Laws MD Pathologist, Electronic Signature ( Signed 04/13/2017) FLUORESCENCE IN-SITU HYBRIDIZATION Results: HER2 - NEGATIVE RATIO OF HER2/CEP17 SIGNALS 1.44 AVERAGE HER2 COPY NUMBER PER CELL 1.95 Reference Range: NEGATIVE HER2/CEP17 Ratio <2.0 and average HER2 copy number <4.0 EQUIVOCAL HER2/CEP17 Ratio <2.0 and average HER2 copy number >=4.0 and <6.0 1 of 3 FINAL for Tonya Oconnell ([id number]) ADDITIONAL INFORMATION:(continued) POSITIVE HER2/CEP17 Ratio >=2.0 or <2.0 and average HER2 copy number >=6.0 [name] MD Pathologist, Electronic Signature ( Signed 04/12/2017) FINAL DIAGNOSIS Diagnosis Breast, right, needle core biopsy, 11:30 o'clock - 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 Immunohistochemistry was performed to assess for invasion. SMM, p63 and calponin are negative in the  foci of interest confirming the absence of a myoepithelial cell layer which supports the diagnosis of invasive carcinoma. The tumor is Nottingham Grade 1 of 3 based on the biopsy material. Dr. Tresa Moore reviewed the case and agrees with the above assessment. This case was called to [facility] on April 11, 2017. Prognostic markers are pending and will be reported in an addendum. Thressa Sheller MD Pathologist, Electronic Signature (Case signed 04/11/2017) Specimen Gross and Clinical Information Specimen Comment In formalin: 2:54 PM; extracted < 5 min; distortion/mass Specimen(s) Obtained: Breast, right, needle core biopsy, 11:30 o'clock Specimen Clinical Information Suspect Tonya Oconnell Gross Received labeled "Tonya Oconnell" and "Rt breast 1130" (TIF 1454 CIT <83mn) are 3 cores of gray white to yellow soft to firm tissue, ranging from 1.1 x 0.1 x 0.1 cm to 1.6 x 0.15 x 0.1 cm. One block submitted. (SSW 4/20) Stain(s) used in Diagnosis: The following stain(s) were used in diagnosing the case: P63, PR-ACIS, KI-67-ACIS, Her2 FISH, ER-ACIS, Smooth Muscle Myosin - 1 Heavy Chain, Calponin. The control(s) stained appropriately. 2 of 3 FINAL for Tonya Oconnell [phone] Disclaimer Ki-67 (MM1), immunohistochemical stains are performed on formalin fixed, paraffin embedded tissue using a 3,3"-diaminobenzidine (DAB) chromogen and [place] The staining intensity of the nucleus is scored manually and is reported as the percentage of tumor cell nuclei demonstrating specific nuclear staining.Specimens are fixed in 10% Neutral Buffered Formalin for at least 6 hours and up to 72 hours. These tests have not be validated on decalcified tissue. Results should be interpreted with caution given the possibility of false negative results on decalcified specimens. Estrogen receptor (6F11), immunohistochemical stains are performed on formalin fixed, paraffin embedded tissue using a  3,3"-diaminobenzidine (DAB) chromogen and Leica Bond Autostainer System. The staining intensity of the nucleus is scored manually and is reported as the percentage of tumor cell nuclei  demonstrating specific nuclear staining.Specimens are fixed in 10% Neutral Buffered Formalin for at least 6 hours and up to 72 hours. These tests have not be validated on decalcified tissue. Results should be interpreted with caution given the possibility of false negative results on decalcified specimens. HER2 IQFISH pharmDX (code [phone]) is a direct fluorescence in-situ hybridization assay designed to quantitatively determine HER2 gene amplification in formalin-fixed, paraffin-embedded tissue specimens. It is performed at [facility] and is reported using ASCO/CAP scoring criteria published in 2013. Some of these immunohistochemical stains may have been developed and the performance characteristics determined by [facility]. Some may not have been cleared or approved by the U.S. Food and Drug Administration. The FDA has determined that such clearance or approval is not necessary. This test is used for clinical purposes. It should not be regarded as investigational or for research. This laboratory is certified under the [place] (CLIA-88) as qualified to perform high complexity clinical laboratory testing. PR progesterone receptor (16), immunohistochemical stains are performed on formalin fixed, paraffin embedded tissue using a 3,3"-diaminobenzidine (DAB) chromogen and Leica Bond Autostainer System. The staining intensity of the nucleus is scored manually and is reported as the percentage of tumor cell nuclei demonstrating specific nuclear staining.Specimens are fixed in 10% Neutral Buffered Formalin for at least 6 hours and up to 72 hours. These tests have not be validated on decalcified tissue. Results should be interpreted with caution given the possibility  of false negative results on decalcified specimens. Report signed out from the following location(s) Technical Component was performed at [facility]. [address],[place] [address].[id number]., Technical component and interpretation was performed at [address], [place], [place] [address]. CLIA #: [id number], 3 of     CLINICAL DATA: Status post ultrasound-guided core biopsy of mass in the 11:30 o'clock location of the right breast.  EXAM: DIAGNOSTIC RIGHT MAMMOGRAM POST ULTRASOUND BIOPSY  COMPARISON: Previous exam(s).  FINDINGS: Mammographic images were obtained following ultrasound guided biopsy of mass in the 11:30 o'clock location of the right breast. A ribbon shaped clip is identified in the upper-outer quadrant of the right breast as expected. Clip is centered within mammographically detected distortion.  IMPRESSION: Tissue marker clip in the expected location following biopsy.  Final Assessment: Post Procedure Mammograms for Marker Placement   Electronically Signed By: Nolon Nations M.D. On: 04/07/2017 15:40.  The patient is a 56 year old female.   Past Surgical History Tawni Pummel, RN; 04/19/2017 7:49 AM) Breast Biopsy Right. multiple Hysterectomy (not due to cancer) - Partial  Diagnostic Studies History Tawni Pummel, RN; 04/19/2017 7:49 AM) Colonoscopy 1-5 years ago Mammogram within last year Pap Smear 1-5 years ago  Medication History Tawni Pummel, RN; 04/19/2017 7:49 AM) Medications Reconciled  Social History Tawni Pummel, RN; 04/19/2017 7:49 AM) Caffeine use Coffee. No alcohol use No drug use Tobacco use Never smoker.  Family History Tawni Pummel, RN; 04/19/2017 7:49 AM) Arthritis Father. Depression Father.  Pregnancy / Birth History Tawni Pummel, RN; 04/19/2017 7:49 AM) Age at menarche 63 years. Age of menopause 29-50 Contraceptive  History Oral contraceptives. Gravida 2 Length (months) of breastfeeding 3-6 Maternal age 47-30 Para 1     Review of Systems Tawni Pummel RN; 04/19/2017 7:49 AM) General Not Present- Appetite Loss, Chills, Fatigue, Fever, Night Sweats, Weight Gain and Weight Loss. Skin Not Present- Change in Wart/Mole, Dryness, Hives, Jaundice, New Lesions, Non-Healing Wounds, Rash and Ulcer. HEENT Present- Wears glasses/contact lenses. Not  Present- Earache, Hearing Loss, Hoarseness, Nose Bleed, Oral Ulcers, Ringing in the Ears, Seasonal Allergies, Sinus Pain, Sore Throat, Visual Disturbances and Yellow Eyes. Respiratory Not Present- Bloody sputum, Chronic Cough, Difficulty Breathing, Snoring and Wheezing. Breast Present- Breast Mass. Not Present- Breast Pain, Nipple Discharge and Skin Changes. Cardiovascular Not Present- Chest Pain, Difficulty Breathing Lying Down, Leg Cramps, Palpitations, Rapid Heart Rate, Shortness of Breath and Swelling of Extremities. Gastrointestinal Not Present- Abdominal Pain, Bloating, Bloody Stool, Change in Bowel Habits, Chronic diarrhea, Constipation, Difficulty Swallowing, Excessive gas, Gets full quickly at meals, Hemorrhoids, Indigestion, Nausea, Rectal Pain and Vomiting. Female Genitourinary Not Present- Frequency, Nocturia, Painful Urination, Pelvic Pain and Urgency. Musculoskeletal Not Present- Back Pain, Joint Pain, Joint Stiffness, Muscle Pain, Muscle Weakness and Swelling of Extremities. Neurological Not Present- Decreased Memory, Fainting, Headaches, Numbness, Seizures, Tingling, Tremor, Trouble walking and Weakness. Psychiatric Not Present- Anxiety, Bipolar, Change in Sleep Pattern, Depression, Fearful and Frequent crying. Endocrine Not Present- Cold Intolerance, Excessive Hunger, Hair Changes, Heat Intolerance, Hot flashes and New Diabetes. Hematology Not Present- Blood Thinners, Easy Bruising, Excessive bleeding, Gland problems, HIV and Persistent  Infections.   Physical Exam (Thomas A. Cornett MD; 04/19/2017 2:56 PM)  General Mental Status-Alert. General Appearance-Consistent with stated age. Hydration-Well hydrated. Voice-Normal.  Head and Neck Head-normocephalic, atraumatic with no lesions or palpable masses. Trachea-midline. Thyroid Gland Characteristics - normal size and consistency.  Chest and Lung Exam Chest and lung exam reveals -quiet, even and easy respiratory effort with no use of accessory muscles and on auscultation, normal breath sounds, no adventitious sounds and normal vocal resonance. Inspection Chest Wall - Normal. Back - normal.  Breast Breast - Left-Symmetric, Non Tender, No Biopsy scars, no Dimpling, No Inflammation, No Lumpectomy scars, No Mastectomy scars, No Peau d' Orange. Breast - Right-Symmetric, Non Tender, No Biopsy scars, no Dimpling, No Inflammation, No Lumpectomy scars, No Mastectomy scars, No Peau d' Orange. Breast Lump-No Palpable Breast Mass. Note: bruising right breast  Cardiovascular Cardiovascular examination reveals -normal heart sounds, regular rate and rhythm with no murmurs and normal pedal pulses bilaterally.  Neurologic Neurologic evaluation reveals -alert and oriented x 3 with no impairment of recent or remote memory. Mental Status-Normal.  Musculoskeletal Normal Exam - Left-Upper Extremity Strength Normal and Lower Extremity Strength Normal. Normal Exam - Right-Upper Extremity Strength Normal and Lower Extremity Strength Normal.  Lymphatic Head & Neck  General Head & Neck Lymphatics: Bilateral - Description - Normal. Axillary  General Axillary Region: Bilateral - Description - Normal. Tenderness - Non Tender.    Assessment & Plan (Thomas A. Cornett MD; 04/19/2017 2:58 PM)  BREAST CANCER, RIGHT (C50.911) Impression: right breast cancer Discussed lumpectomy vs mastectomy and SLN mapping Risk of lumpectomy include bleeding, infection,  seroma, more surgery, use of seed/wire, wound care, cosmetic deformity and the need for other treatments, death , blood clots, death. Pt agrees to proceed. Risk of sentinel lymph node mapping include bleeding, infection, lymphedema, shoulder pain. stiffness, dye allergy. cosmetic deformity , blood clots, death, need for more surgery. Pt agres to proceed.  Current Plans You are being scheduled for surgery- Our schedulers will call you.  You should hear from our office's scheduling department within 5 working days about the location, date, and time of surgery. We try to make accommodations for patient's preferences in scheduling surgery, but sometimes the OR schedule or the surgeon's schedule prevents [place] from making those accommodations.  If you have not heard from our office [phone]) in 5 working days, call the office and ask  for your surgeon's nurse.  If you have other questions about your diagnosis, plan, or surgery, call the office and ask for your surgeon's nurse.  Pt Education - [facility] Breast Cancer Information Given - [employer] "Breast Journey" Package We discussed the staging and pathophysiology of breast cancer. We discussed all of the different options for treatment for breast cancer including surgery, chemotherapy, radiation therapy, Herceptin, and antiestrogen therapy. We discussed a sentinel lymph node biopsy as she does not appear to having lymph node involvement right now. We discussed the performance of that with injection of radioactive tracer and blue dye. We discussed that she would have an incision underneath her axillary hairline. We discussed that there is a bout a 10-20% chance of having a positive node with a sentinel lymph node biopsy and we will await the permanent pathology to make any other first further decisions in terms of her treatment. One of these options might be to return to the operating room to perform an axillary lymph node dissection. We discussed about a 1-2%  risk lifetime of chronic shoulder pain as well as lymphedema associated with a sentinel lymph node biopsy. We discussed the options for treatment of the breast cancer which included lumpectomy versus a mastectomy. We discussed the performance of the lumpectomy with a wire placement. We discussed a 10-20% chance of a positive margin requiring reexcision in the operating room. We also discussed that she may need radiation therapy or antiestrogen therapy or both if she undergoes lumpectomy. We discussed the mastectomy and the postoperative care for that as well. We discussed that there is no difference in her survival whether she undergoes lumpectomy with radiation therapy or antiestrogen therapy versus a mastectomy. There is a slight difference in the local recurrence rate being 3-5% with lumpectomy and about 1% with a mastectomy. We discussed the risks of operation including bleeding, infection, possible reoperation. She understands her further therapy will be based on what her stages at the time of her operation.  Pt Education - [facility] breast cancer surgery: discussed with patient and provided information. Pt Education - [facility] Breast Biopsy HCI: discussed with patient and provided information. Pt Education - ABC (After Breast Cancer) Class Info: discussed with patient and provided information.

## 2017-05-17 NOTE — Progress Notes (Signed)
Assisted Dr. Annye Asa with right, ultrasound guided, pectoralis block. Side rails up, monitors on throughout procedure. See vital signs in flow sheet. Tolerated Procedure well.

## 2017-05-18 ENCOUNTER — Encounter (HOSPITAL_BASED_OUTPATIENT_CLINIC_OR_DEPARTMENT_OTHER): Payer: Self-pay | Admitting: Surgery

## 2017-05-24 ENCOUNTER — Ambulatory Visit: Payer: BLUE CROSS/BLUE SHIELD | Admitting: Hematology and Oncology

## 2017-05-26 ENCOUNTER — Ambulatory Visit (HOSPITAL_BASED_OUTPATIENT_CLINIC_OR_DEPARTMENT_OTHER): Payer: BLUE CROSS/BLUE SHIELD | Admitting: Hematology and Oncology

## 2017-05-26 ENCOUNTER — Encounter: Payer: Self-pay | Admitting: Radiation Oncology

## 2017-05-26 ENCOUNTER — Encounter: Payer: Self-pay | Admitting: Hematology and Oncology

## 2017-05-26 DIAGNOSIS — C50411 Malignant neoplasm of upper-outer quadrant of right female breast: Secondary | ICD-10-CM

## 2017-05-26 DIAGNOSIS — Z170 Estrogen receptor positive status [ER+]: Principal | ICD-10-CM

## 2017-05-26 MED ORDER — VENLAFAXINE HCL ER 37.5 MG PO CP24: 37.5000 mg | ORAL_CAPSULE | Freq: Every day | ORAL | 0 refills | Status: DC

## 2017-05-26 MED ORDER — ANASTROZOLE 1 MG PO TABS: 1.0000 mg | ORAL_TABLET | Freq: Every day | ORAL | 3 refills | Status: DC

## 2017-05-26 NOTE — Assessment & Plan Note (Signed)
05/17/2017 Right lumpectomy: IDC grade 1, 0.9 cm, DCIS grade 1, margins negative, 0/6 lymph nodes negative, ER 90%, PR 80%, HER-2 negative, Ki-67 2% T1b N0 stage IA  Pathology counseling: I discussed the final pathology report of the patient provided  a copy of this report. I discussed the margins as well as lymph node surgeries. We also discussed the final staging along with previously performed ER/PR and HER-2/neu testing.  Recommendation: 1. Based on the favorable pathology report, I did not recommend Oncotype DX testing because it is felt that she has a low-grade disease and the tumor is less than 1 cm. 2. adjuvant radiation therapy followed by 3. Adjuvant antiestrogen therapy  Return to clinic after radiation to start antiestrogen therapy.

## 2017-05-26 NOTE — Progress Notes (Signed)
Patient Care Team: Tempie Hoist, FNP as PCP - General (Family Medicine) Erroll Luna, MD as Consulting Physician (General Surgery) Nicholas Lose, MD as Consulting Physician (Hematology and Oncology) Kyung Rudd, MD as Consulting Physician (Radiation Oncology)  DIAGNOSIS:  Encounter Diagnosis  Name Primary?  . Malignant neoplasm of upper-outer quadrant of right breast in female, estrogen receptor positive ([place])     SUMMARY OF ONCOLOGIC HISTORY:   Malignant neoplasm of upper-outer quadrant of right breast in female, estrogen receptor positive ([place])   04/07/2017 Initial Diagnosis    Screening detected right breast asymmetry and distortion at 11:30: 1.3 cm Right breast biopsy 11:30: Grade 1 IDC with microcalcifications ER 90%, PR 80%, Ki-67 2%, HER-2 negative ratio 1.44; T1cN0 stage IA AJCC 8      05/17/2017 Surgery    Right lumpectomy: IDC grade 1, 0.9 cm, DCIS grade 1, margins negative, 0/6 lymph nodes negative, ER 90%, PR 80%, HER-2 negative, Ki-67 2% T1b N0 stage IA       CHIEF COMPLIANT: Follow-up after right lumpectomy  INTERVAL HISTORY: Tonya Oconnell is a 56 year old with above-mentioned history of right breast cancer treated with lumpectomy and is here today to discuss the pathology report. She is recovering very well from the surgery. She has mild discomfort underneath the axilla.  REVIEW OF SYSTEMS:   Constitutional: Denies fevers, chills or abnormal weight loss Eyes: Denies blurriness of vision Ears, nose, mouth, throat, and face: Denies mucositis or sore throat Respiratory: Denies cough, dyspnea or wheezes Cardiovascular: Denies palpitation, chest discomfort Gastrointestinal:  Denies nausea, heartburn or change in bowel habits Skin: Denies abnormal skin rashes Lymphatics: Denies new lymphadenopathy or easy bruising Neurological:Denies numbness, tingling or new weaknesses Behavioral/Psych: Mood is stable, no new changes  Extremities: No lower extremity  edema Breast: Recent right lumpectomy All other systems were reviewed with the patient and are negative.  I have reviewed the past medical history, past surgical history, social history and family history with the patient and they are unchanged from previous note.  ALLERGIES:  is allergic to morphine and related and sulfa antibiotics.  MEDICATIONS:  Current Outpatient Prescriptions  Medication Sig Dispense Refill  . [START ON 07/26/2017] anastrozole (ARIMIDEX) 1 MG tablet Take 1 tablet (1 mg total) by mouth daily. Start 07/26/17 90 tablet 3  . valACYclovir (VALTREX) 1000 MG tablet Take 1,000 mg by mouth daily.    Marland Kitchen venlafaxine XR (EFFEXOR-XR) 37.5 MG 24 hr capsule Take 1 capsule (37.5 mg total) by mouth daily with breakfast. 30 capsule 0   No current facility-administered medications for this visit.     PHYSICAL EXAMINATION: ECOG PERFORMANCE STATUS: 1 - Symptomatic but completely ambulatory  Vitals:   05/26/17 1003  BP: (!) 121/91  Pulse: 67  Resp: 18  Temp: 98.2 F (36.8 C)   Filed Weights   05/26/17 1003  Weight: 151 lb 6.4 oz (68.7 kg)    GENERAL:alert, no distress and comfortable SKIN: skin color, texture, turgor are normal, no rashes or significant lesions EYES: normal, Conjunctiva are pink and non-injected, sclera clear OROPHARYNX:no exudate, no erythema and lips, buccal mucosa, and tongue normal  NECK: supple, thyroid normal size, non-tender, without nodularity LYMPH:  no palpable lymphadenopathy in the cervical, axillary or inguinal LUNGS: clear to auscultation and percussion with normal breathing effort HEART: regular rate & rhythm and no murmurs and no lower extremity edema ABDOMEN:abdomen soft, non-tender and normal bowel sounds MUSCULOSKELETAL:no cyanosis of digits and no clubbing  NEURO: alert & oriented x 3 with fluent  speech, no focal motor/sensory deficits EXTREMITIES: No lower extremity edema  LABORATORY DATA:  I have reviewed the data as listed    Chemistry      Component Value Date/Time   NA 144 04/19/2017 1311   K 3.8 04/19/2017 1311   CO2 28 04/19/2017 1311   BUN 9.7 04/19/2017 1311   CREATININE 1.0 04/19/2017 1311      Component Value Date/Time   CALCIUM 9.6 04/19/2017 1311   ALKPHOS 84 04/19/2017 1311   AST 16 04/19/2017 1311   ALT 14 04/19/2017 1311   BILITOT 0.29 04/19/2017 1311       Lab Results  Component Value Date   WBC 6.1 04/19/2017   HGB 13.4 04/19/2017   HCT 40.1 04/19/2017   MCV 87.7 04/19/2017   PLT 360 04/19/2017   NEUTROABS 3.8 04/19/2017    ASSESSMENT & PLAN:  Malignant neoplasm of upper-outer quadrant of right breast in female, estrogen receptor positive ([place]) 05/17/2017 Right lumpectomy: IDC grade 1, 0.9 cm, DCIS grade 1, margins negative, 0/6 lymph nodes negative, ER 90%, PR 80%, HER-2 negative, Ki-67 2% T1b N0 stage IA  Pathology counseling: I discussed the final pathology report of the patient provided  a copy of this report. I discussed the margins as well as lymph node surgeries. We also discussed the final staging along with previously performed ER/PR and HER-2/neu testing.  Recommendation: 1. Based on the favorable pathology report, I did not recommend Oncotype DX testing because it is felt that she has a low-grade disease and the tumor is less than 1 cm. 2. adjuvant radiation therapy followed by 3. Adjuvant antiestrogen therapy  Return to clinic after radiation to start antiestrogen therapy. I given a prescription for anastrozole 1 mg daily to be taken after radiation is complete.  Anastrozole counseling:We discussed the risks and benefits of anti-estrogen therapy with aromatase inhibitors. These include but not limited to insomnia, hot flashes, mood changes, vaginal dryness, bone density loss, and weight gain. We strongly believe that the benefits far outweigh the risks. Patient understands these risks and consented to starting treatment. Planned treatment duration is 5 years.  I  spent 25 minutes talking to the patient of which more than half was spent in counseling and coordination of care.  No orders of the defined types were placed in this encounter.  The patient has a good understanding of the overall plan. she agrees with it. she will call with any problems that may develop before the next visit here.   Rulon Eisenmenger, MD 05/26/17

## 2017-05-30 ENCOUNTER — Telehealth: Payer: Self-pay | Admitting: *Deleted

## 2017-05-30 ENCOUNTER — Telehealth: Payer: Self-pay | Admitting: Hematology and Oncology

## 2017-05-30 NOTE — Telephone Encounter (Signed)
Called patient to inform of [facility]  Appt. With Dr. Gwendlyn Deutscher on June 26- arrival time - 8:45 am , address - [address], [place], [place]., ph. No. [phone], spoke with patient and she is aware of this appt.

## 2017-05-30 NOTE — Telephone Encounter (Signed)
Faxed records to Dr. Rhunette Croft in [place]

## 2017-06-20 ENCOUNTER — Other Ambulatory Visit: Payer: Self-pay | Admitting: *Deleted

## 2017-06-20 MED ORDER — VENLAFAXINE HCL ER 75 MG PO CP24: 75.0000 mg | ORAL_CAPSULE | Freq: Every day | ORAL | 3 refills | Status: DC

## 2017-07-11 ENCOUNTER — Other Ambulatory Visit: Payer: Self-pay | Admitting: *Deleted

## 2017-07-11 ENCOUNTER — Encounter: Payer: Self-pay | Admitting: *Deleted

## 2017-07-11 MED ORDER — GABAPENTIN 300 MG PO CAPS: 300.0000 mg | ORAL_CAPSULE | Freq: Every day | ORAL | 3 refills | Status: DC

## 2017-07-11 NOTE — Progress Notes (Signed)
Pt called with c/o increase in hot flashes. Discussed with Dr. Lindi Adie. Received order for gabapentin 300mg  qhs. Called pt with recommendation. Sent to pt preferred pharmacy. Gave instructions and directions. Denies further needs or questions at this time.

## 2017-08-17 ENCOUNTER — Ambulatory Visit: Payer: BLUE CROSS/BLUE SHIELD | Admitting: Hematology and Oncology

## 2017-08-18 ENCOUNTER — Ambulatory Visit: Payer: BLUE CROSS/BLUE SHIELD | Admitting: Hematology and Oncology

## 2017-08-29 ENCOUNTER — Other Ambulatory Visit: Payer: Self-pay | Admitting: *Deleted

## 2017-08-29 MED ORDER — VENLAFAXINE HCL ER 37.5 MG PO CP24: 37.5000 mg | ORAL_CAPSULE | Freq: Every day | ORAL | 0 refills | Status: DC

## 2017-08-29 MED ORDER — GABAPENTIN 300 MG PO CAPS: 300.0000 mg | ORAL_CAPSULE | Freq: Two times a day (BID) | ORAL | 3 refills | Status: DC

## 2017-08-29 NOTE — Telephone Encounter (Signed)
Pt called with c/o of continued hot flashes even with increase dose of Effexor.  Per dr. Lindi Adie, increase gabapentin 300mg  BID and decrease Effexor to 37.5mg  for 30 days then d/c. Called pt with instructions. Denies further needs at this time

## 2017-10-02 ENCOUNTER — Telehealth: Payer: Self-pay | Admitting: *Deleted

## 2017-10-02 ENCOUNTER — Telehealth: Payer: Self-pay

## 2017-10-02 NOTE — Telephone Encounter (Signed)
Returned pt call regarding increased bp over the weekend. Pt went to urgent care bc of BP elevated in the 160's/110's. Pt was told by her pharmacist at [facility] to stop taking effexor and get checked out. While pt was in the urgent care, her BP remained elevated, however, it did decrease on its own in 140's/80's. No further intervention were done in urgent care last week. Pt was advised to call her pcp today. Pt wants to know if it was okay if she also weaned off her gabapentin. Pt states that she was still having hot flashes when she was on effexor and gabapentin. Pt is deciding that she would rather tolerate her hot flashes rather than having her BP go up. Told pt that gabapentin would not typically cause increased bp especially if she had been on it for a while. Advised pt to seek assistance with her primary dr to get her blood pressure work up. Pt verbalized understanding and will call her pcp today. Per Dr.Gudena, pt may wean off gabapentin if she request not to take it. Gave pt instructions to wean off gabapentin to 1 per day for 1 week and every other day 2nd week until she is off the 3rd week. Pt wrote instructions and verbalized to this RN. No further concerns at this time. Pt to have fu with Dr.Gudena in dec 2018.Pt knows to call with any further concerns.

## 2017-10-02 NOTE — Telephone Encounter (Signed)
Return pt call regarding BP elevation. Reinforced Dr. Lindi Adie instructions that May relayed. Pt currently sitting at PCP. Denies further questions or needs.

## 2017-11-17 ENCOUNTER — Telehealth: Payer: Self-pay

## 2017-11-17 ENCOUNTER — Ambulatory Visit: Payer: BLUE CROSS/BLUE SHIELD | Admitting: Hematology and Oncology

## 2017-11-17 NOTE — Telephone Encounter (Signed)
Pt's last radiation tx was Aug 23. Her upcoming appt is for Dr Lindi Adie only. She is asking when mammograms and repeat scans are due.   Pt would like to change appt from Dec 7 to Dec 3rd if possible.

## 2017-11-17 NOTE — Telephone Encounter (Signed)
Called pt and lvm with call back number to let her know that her appt was changed to see Dr.Gudena on Monday and she can discuss about follow up mammograms and surveillance testing with him.then.

## 2017-11-20 ENCOUNTER — Telehealth: Payer: Self-pay | Admitting: Hematology and Oncology

## 2017-11-20 ENCOUNTER — Ambulatory Visit (HOSPITAL_BASED_OUTPATIENT_CLINIC_OR_DEPARTMENT_OTHER): Payer: BLUE CROSS/BLUE SHIELD | Admitting: Hematology and Oncology

## 2017-11-20 DIAGNOSIS — Z170 Estrogen receptor positive status [ER+]: Secondary | ICD-10-CM

## 2017-11-20 DIAGNOSIS — C50411 Malignant neoplasm of upper-outer quadrant of right female breast: Secondary | ICD-10-CM | POA: Diagnosis not present

## 2017-11-20 MED ORDER — LISINOPRIL 10 MG PO TABS: 10.0000 mg | ORAL_TABLET | Freq: Every day | ORAL | Status: DC

## 2017-11-20 MED ORDER — ANASTROZOLE 1 MG PO TABS: 1.0000 mg | ORAL_TABLET | Freq: Every day | ORAL | 3 refills | Status: DC

## 2017-11-20 NOTE — Progress Notes (Signed)
Patient Care Team: Tempie Hoist, FNP as PCP - General (Family Medicine) Erroll Luna, MD as Consulting Physician (General Surgery) Nicholas Lose, MD as Consulting Physician (Hematology and Oncology) Kyung Rudd, MD as Consulting Physician (Radiation Oncology)  DIAGNOSIS:  Encounter Diagnosis  Name Primary?  . Malignant neoplasm of upper-outer quadrant of right breast in female, estrogen receptor positive ([name])     SUMMARY OF ONCOLOGIC HISTORY:   Malignant neoplasm of upper-outer quadrant of right breast in female, estrogen receptor positive ([name])   04/07/2017 Initial Diagnosis    Screening detected right breast asymmetry and distortion at 11:30: 1.3 cm Right breast biopsy 11:30: Grade 1 IDC with microcalcifications ER 90%, PR 80%, Ki-67 2%, HER-2 negative ratio 1.44; T1cN0 stage IA AJCC 8      05/17/2017 Surgery    Right lumpectomy: IDC grade 1, 0.9 cm, DCIS grade 1, margins negative, 0/6 lymph nodes negative, ER 90%, PR 80%, HER-2 negative, Ki-67 2% T1b N0 stage IA      06/20/2017 - 08/10/2017 Radiation Therapy    Adjuvant radiation      08/21/2017 -  Anti-estrogen oral therapy    Anastrozole 1 mg daily       CHIEF COMPLIANT: Follow-up on anastrozole therapy  INTERVAL HISTORY: Tonya Oconnell is a 56 year old with above-mentioned history of right breast cancer treated with lumpectomy and radiation is currently on anastrozole.  She had a lot of hot flashes and tried Effexor which did not help and she also tried gabapentin and she decided that she wants to come off all of these medications.  After she stopped Effexor she had prolonged recovery with some drowsiness and dizziness episodes.  Finally she got overall that.  The hot flash symptoms spontaneously improved.  REVIEW OF SYSTEMS:   Constitutional: Denies fevers, chills or abnormal weight loss Eyes: Denies blurriness of vision Ears, nose, mouth, throat, and face: Denies mucositis or sore throat Respiratory: Denies cough,  dyspnea or wheezes Cardiovascular: Denies palpitation, chest discomfort Gastrointestinal:  Denies nausea, heartburn or change in bowel habits Skin: Denies abnormal skin rashes Lymphatics: Denies new lymphadenopathy or easy bruising Neurological:Denies numbness, tingling or new weaknesses Behavioral/Psych: Mood is stable, no new changes  Extremities: No lower extremity edema Breast:  denies any pain or lumps or nodules in either breasts; tenderness along the surgical scars and lumpiness around 10 o'clock position All other systems were reviewed with the patient and are negative.  I have reviewed the past medical history, past surgical history, social history and family history with the patient and they are unchanged from previous note.  ALLERGIES:  is allergic to morphine and related and sulfa antibiotics.  MEDICATIONS:  Current Outpatient Medications  Medication Sig Dispense Refill  . anastrozole (ARIMIDEX) 1 MG tablet Take 1 tablet (1 mg total) by mouth daily. Start 07/26/17 90 tablet 3  . gabapentin (NEURONTIN) 300 MG capsule Take 1 capsule (300 mg total) by mouth 2 (two) times daily. 60 capsule 3  . valACYclovir (VALTREX) 1000 MG tablet Take 1,000 mg by mouth daily.    Marland Kitchen venlafaxine XR (EFFEXOR-XR) 37.5 MG 24 hr capsule Take 1 capsule (37.5 mg total) by mouth daily with breakfast. 30 capsule 0   No current facility-administered medications for this visit.     PHYSICAL EXAMINATION: ECOG PERFORMANCE STATUS: 1 - Symptomatic but completely ambulatory  Vitals:   11/20/17 1006  BP: (!) 151/101  Pulse: 85  Resp: 20  Temp: 98.2 F (36.8 C)  SpO2: 100%   Filed Weights  11/20/17 1006  Weight: 148 lb 3.2 oz (67.2 kg)    GENERAL:alert, no distress and comfortable SKIN: skin color, texture, turgor are normal, no rashes or significant lesions EYES: normal, Conjunctiva are pink and non-injected, sclera clear OROPHARYNX:no exudate, no erythema and lips, buccal mucosa, and tongue  normal  NECK: supple, thyroid normal size, non-tender, without nodularity LYMPH:  no palpable lymphadenopathy in the cervical, axillary or inguinal LUNGS: clear to auscultation and percussion with normal breathing effort HEART: regular rate & rhythm and no murmurs and no lower extremity edema ABDOMEN:abdomen soft, non-tender and normal bowel sounds MUSCULOSKELETAL:no cyanosis of digits and no clubbing  NEURO: alert & oriented x 3 with fluent speech, no focal motor/sensory deficits EXTREMITIES: No lower extremity edema BREAST:*Palpable nodularity in the right breast 10 o'clock position. No palpable axillary supraclavicular or infraclavicular adenopathy no breast tenderness or nipple discharge. (exam performed in the presence of a chaperone)  LABORATORY DATA:  I have reviewed the data as listed   Chemistry      Component Value Date/Time   NA 144 04/19/2017 1311   K 3.8 04/19/2017 1311   CO2 28 04/19/2017 1311   BUN 9.7 04/19/2017 1311   CREATININE 1.0 04/19/2017 1311      Component Value Date/Time   CALCIUM 9.6 04/19/2017 1311   ALKPHOS 84 04/19/2017 1311   AST 16 04/19/2017 1311   ALT 14 04/19/2017 1311   BILITOT 0.29 04/19/2017 1311       Lab Results  Component Value Date   WBC 6.1 04/19/2017   HGB 13.4 04/19/2017   HCT 40.1 04/19/2017   MCV 87.7 04/19/2017   PLT 360 04/19/2017   NEUTROABS 3.8 04/19/2017    ASSESSMENT & PLAN:  Malignant neoplasm of upper-outer quadrant of right breast in female, estrogen receptor positive ([name]) 05/17/2017 Right lumpectomy: IDC grade 1, 0.9 cm, DCIS grade 1, margins negative, 0/6 lymph nodes negative, ER 90%, PR 80%, HER-2 negative, Ki-67 2% T1b N0 stage IA  Treatment summary: 1. Based on the favorable pathology report, I did not recommend Oncotype DX testing because it is felt that she has a low-grade disease and the tumor is less than 1 cm. 2. adjuvant radiation therapy completed 08/10/2017 3. Adjuvant antiestrogen therapy started  08/19/2017  Anastrozole toxicities: 1.  Hot flashes improved significantly.  She stopped both Effexor and gabapentin..  Surveillance: 1.  Mammograms will be arranged for April  Return to clinic in 1 year for follow-up   I spent 25 minutes talking to the patient of which more than half was spent in counseling and coordination of care.  No orders of the defined types were placed in this encounter.  The patient has a good understanding of the overall plan. she agrees with it. she will call with any problems that may develop before the next visit here.   Rulon Eisenmenger, MD 11/20/17

## 2017-11-20 NOTE — Assessment & Plan Note (Signed)
05/17/2017 Right lumpectomy: IDC grade 1, 0.9 cm, DCIS grade 1, margins negative, 0/6 lymph nodes negative, ER 90%, PR 80%, HER-2 negative, Ki-67 2% T1b N0 stage IA  Treatment summary: 1. Based on the favorable pathology report, I did not recommend Oncotype DX testing because it is felt that she has a low-grade disease and the tumor is less than 1 cm. 2. adjuvant radiation therapy completed 08/10/2017 3. Adjuvant antiestrogen therapy started 08/19/2017  Anastrozole toxicities: 1.  Hot flashes for which she is using gabapentin at bedtime.  Surveillance: 1.  Mammograms will be arranged for April  Return to clinic in 6 months for follow-up

## 2017-11-20 NOTE — Telephone Encounter (Signed)
Gave patient AVS and calendar of upcoming December 2019 appointments.  °

## 2017-11-24 ENCOUNTER — Ambulatory Visit: Payer: BLUE CROSS/BLUE SHIELD | Admitting: Hematology and Oncology

## 2018-02-14 ENCOUNTER — Telehealth: Payer: Self-pay

## 2018-02-14 NOTE — Telephone Encounter (Signed)
LVM for pt reminding SCP appt on 02/23/18 at 2 pm with LC.  Left center number for call back with questions or concerns.

## 2018-02-23 ENCOUNTER — Encounter: Payer: BLUE CROSS/BLUE SHIELD | Admitting: Adult Health

## 2018-03-15 ENCOUNTER — Telehealth: Payer: Self-pay | Admitting: Hematology and Oncology

## 2018-03-15 NOTE — Telephone Encounter (Signed)
Faxed requested information to [facility] for Radiation Oncology on 03/15/18, Release ID # [id number]

## 2018-04-11 ENCOUNTER — Ambulatory Visit
Admission: RE | Admit: 2018-04-11 | Discharge: 2018-04-11 | Disposition: A | Discharge status: Home / Self Care | Payer: BLUE CROSS/BLUE SHIELD | Source: Ambulatory Visit | Attending: Hematology and Oncology | Admitting: Hematology and Oncology

## 2018-04-11 ENCOUNTER — Other Ambulatory Visit: Payer: Self-pay | Admitting: Hematology and Oncology

## 2018-04-11 DIAGNOSIS — C50411 Malignant neoplasm of upper-outer quadrant of right female breast: Secondary | ICD-10-CM

## 2018-04-11 DIAGNOSIS — Z170 Estrogen receptor positive status [ER+]: Principal | ICD-10-CM

## 2018-04-11 HISTORY — DX: Personal history of irradiation: Z92.3

## 2018-05-24 ENCOUNTER — Telehealth: Payer: Self-pay

## 2018-05-24 NOTE — Telephone Encounter (Signed)
Returned patient call regarding medication issue. Patient is having issues with achy joints. Informed pt to increase Vit D and calcium intake with diet and over the counter supplements. Informed pt she could take 1000mg  calcium daily and 400-500 IU of Vit D daily. Advised patient to try this for two weeks and if symptoms do not Improve then to give [place] a call back and let us know.  Cyndia Bent RN

## 2018-10-16 ENCOUNTER — Telehealth: Payer: Self-pay

## 2018-10-16 MED ORDER — ANASTROZOLE 1 MG PO TABS: 1.0000 mg | ORAL_TABLET | Freq: Every day | ORAL | 0 refills | Status: DC

## 2018-10-16 NOTE — Telephone Encounter (Signed)
Patient called requesting refill of medication, Anastrozole, be sent to new mail order pharmacy [facility].  Medication sent to [facility].  Patient has no further needs at this time.

## 2018-11-19 ENCOUNTER — Telehealth: Payer: Self-pay | Admitting: Hematology and Oncology

## 2018-11-19 ENCOUNTER — Inpatient Hospital Stay: Payer: BLUE CROSS/BLUE SHIELD | Attending: Hematology and Oncology | Admitting: Hematology and Oncology

## 2018-11-19 DIAGNOSIS — Z79811 Long term (current) use of aromatase inhibitors: Secondary | ICD-10-CM | POA: Diagnosis not present

## 2018-11-19 DIAGNOSIS — Z79899 Other long term (current) drug therapy: Secondary | ICD-10-CM | POA: Insufficient documentation

## 2018-11-19 DIAGNOSIS — R232 Flushing: Secondary | ICD-10-CM | POA: Diagnosis not present

## 2018-11-19 DIAGNOSIS — Z923 Personal history of irradiation: Secondary | ICD-10-CM

## 2018-11-19 DIAGNOSIS — C50411 Malignant neoplasm of upper-outer quadrant of right female breast: Secondary | ICD-10-CM | POA: Diagnosis not present

## 2018-11-19 DIAGNOSIS — Z170 Estrogen receptor positive status [ER+]: Secondary | ICD-10-CM

## 2018-11-19 MED ORDER — ANASTROZOLE 1 MG PO TABS: 1.0000 mg | ORAL_TABLET | Freq: Every day | ORAL | 3 refills | Status: DC

## 2018-11-19 MED ORDER — LISINOPRIL 20 MG PO TABS: 20.0000 mg | ORAL_TABLET | Freq: Every day | ORAL | Status: DC

## 2018-11-19 NOTE — Progress Notes (Signed)
Patient Care Team: Tempie Hoist, FNP as PCP - General (Family Medicine) Erroll Luna, MD as Consulting Physician (General Surgery) Nicholas Lose, MD as Consulting Physician (Hematology and Oncology) Kyung Rudd, MD as Consulting Physician (Radiation Oncology)  DIAGNOSIS:  Encounter Diagnosis  Name Primary?  . Malignant neoplasm of upper-outer quadrant of right breast in female, estrogen receptor positive ([place])     SUMMARY OF ONCOLOGIC HISTORY:   Malignant neoplasm of upper-outer quadrant of right breast in female, estrogen receptor positive ([place] Ocon[place])   04/07/2017 Initial Diagnosis    Screening detected right breast asymmetry and distortion at 11:30: 1.3 cm Right breast biopsy 11:30: Grade 1 IDC with microcalcifications ER 90%, PR 80%, Ki-67 2%, HER-2 negative ratio 1.44; T1cN0 stage IA AJCC 8    05/17/2017 Surgery    Right lumpectomy: IDC grade 1, 0.9 cm, DCIS grade 1, margins negative, 0/6 lymph nodes negative, ER 90%, PR 80%, HER-2 negative, Ki-67 2% T1b N0 stage IA    06/20/2017 - 08/10/2017 Radiation Therapy    Adjuvant radiation    08/21/2017 -  Anti-estrogen oral therapy    Anastrozole 1 mg daily     CHIEF COMPLIANT: Follow-up on anastrozole therapy, muscle aches and pains and stiffness  INTERVAL HISTORY: Tonya Oconnell is a 57 year old with above-mentioned history of right breast cancer treated with lumpectomy radiation is current and anastrozole therapy.  She is complaining of diffuse muscle aches and pains along with stiffness especially in the morning as well as when she sits down for a length of time.  She has a hard time getting up her once she is up she is doing fine.  Denies any hot flashes.  Her lab work that she brought in today showed an LDL of 151 and a total cholesterol of 200.  REVIEW OF SYSTEMS:   Constitutional: Denies fevers, chills or abnormal weight loss Eyes: Denies blurriness of vision Ears, nose, mouth, throat, and face: Denies mucositis or sore  throat Respiratory: Denies cough, dyspnea or wheezes Cardiovascular: Denies palpitation, chest discomfort Gastrointestinal:  Denies nausea, heartburn or change in bowel habits Skin: Denies abnormal skin rashes Lymphatics: Denies new lymphadenopathy or easy bruising Neurological:Denies numbness, tingling or new weaknesses Behavioral/Psych: Mood is stable, no new changes  Extremities: No lower extremity edema   All other systems were reviewed with the patient and are negative.  I have reviewed the past medical history, past surgical history, social history and family history with the patient and they are unchanged from previous note.  ALLERGIES:  is allergic to morphine and related and sulfa antibiotics.  MEDICATIONS:  Current Outpatient Medications  Medication Sig Dispense Refill  . anastrozole (ARIMIDEX) 1 MG tablet Take 1 tablet (1 mg total) by mouth daily. Start 07/26/17 90 tablet 0  . lisinopril (PRINIVIL,ZESTRIL) 10 MG tablet Take 1 tablet (10 mg total) by mouth daily.    . valACYclovir (VALTREX) 1000 MG tablet Take 1,000 mg by mouth daily.     No current facility-administered medications for this visit.     PHYSICAL EXAMINATION: ECOG PERFORMANCE STATUS: 1 - Symptomatic but completely ambulatory  Vitals:   11/19/18 1025  BP: (!) 139/98  Pulse: 79  Resp: 18  Temp: 97.7 F (36.5 C)  SpO2: 100%   Filed Weights   11/19/18 1025  Weight: 159 lb 1.6 oz (72.2 kg)    GENERAL:alert, no distress and comfortable SKIN: skin color, texture, turgor are normal, no rashes or significant lesions EYES: normal, Conjunctiva are pink and non-injected, sclera clear  OROPHARYNX:no exudate, no erythema and lips, buccal mucosa, and tongue normal  NECK: supple, thyroid normal size, non-tender, without nodularity LYMPH:  no palpable lymphadenopathy in the cervical, axillary or inguinal LUNGS: clear to auscultation and percussion with normal breathing effort HEART: regular rate & rhythm and no  murmurs and no lower extremity edema ABDOMEN:abdomen soft, non-tender and normal bowel sounds MUSCULOSKELETAL:no cyanosis of digits and no clubbing  NEURO: alert & oriented x 3 with fluent speech, no focal motor/sensory deficits EXTREMITIES: No lower extremity edema BREAST: No palpable masses or nodules in either right or left breasts. No palpable axillary supraclavicular or infraclavicular adenopathy no breast tenderness or nipple discharge. (exam performed in the presence of a chaperone)  LABORATORY DATA:  I have reviewed the data as listed CMP Latest Ref Rng & Units 04/19/2017  Glucose 70 - 140 mg/dl 113  BUN 7.0 - 26.0 mg/dL 9.7  Creatinine 0.6 - 1.1 mg/dL 1.0  Sodium 136 - 145 mEq/L 144  Potassium 3.5 - 5.1 mEq/L 3.8  CO2 22 - 29 mEq/L 28  Calcium 8.4 - 10.4 mg/dL 9.6  Total Protein 6.4 - 8.3 g/dL 7.4  Total Bilirubin 0.20 - 1.20 mg/dL 0.29  Alkaline Phos 40 - 150 U/L 84  AST 5 - 34 U/L 16  ALT 0 - 55 U/L 14    Lab Results  Component Value Date   WBC 6.1 04/19/2017   HGB 13.4 04/19/2017   HCT 40.1 04/19/2017   MCV 87.7 04/19/2017   PLT 360 04/19/2017   NEUTROABS 3.8 04/19/2017    ASSESSMENT & PLAN:  Malignant neoplasm of upper-outer quadrant of right breast in female, estrogen receptor positive ([place] Oconnell) 05/17/2017 Right lumpectomy: IDC grade 1, 0.9 cm, DCIS grade 1, margins negative, 0/6 lymph nodes negative, ER 90%, PR 80%, HER-2 negative, Ki-67 2% T1b N0 stage IA  Treatment summary: 1. Based on the favorable pathology report, I did not recommend Oncotype DX testing because it is felt that she has a low-grade disease and the tumor is less than 1 cm. 2. adjuvant radiation therapy completed 08/10/2017 3. Adjuvant antiestrogen therapy started 08/19/2017  Anastrozole toxicities: 1.  Hot flashes improved significantly.    2.  Muscle aches and stiffness related to letrozole: Instructed her to take letrozole at bedtime instead.  Patient is stressed out about her father's  health.  He has been pushing them away by being mean to them.  Surveillance: 1.  Mammograms  and ultrasound 04/11/2018: No mammographic evidence of malignancy, surgical changes consistent with prior lumpectomy, breast density category C 2.  Breast exam 11/19/2018: Benign  Return to clinic in 1 year for follow-up    No orders of the defined types were placed in this encounter.  The patient has a good understanding of the overall plan. she agrees with it. she will call with any problems that may develop before the next visit here.   Harriette Ohara, MD 11/19/18

## 2018-11-19 NOTE — Telephone Encounter (Signed)
Gave patient avs and calendar.   °

## 2018-11-19 NOTE — Assessment & Plan Note (Signed)
05/17/2017 Right lumpectomy: IDC grade 1, 0.9 cm, DCIS grade 1, margins negative, 0/6 lymph nodes negative, ER 90%, PR 80%, HER-2 negative, Ki-67 2% T1b N0 stage IA  Treatment summary: 1. Based on the favorable pathology report, I did not recommend Oncotype DX testing because it is felt that she has a low-grade disease and the tumor is less than 1 cm. 2. adjuvant radiation therapy completed 08/10/2017 3. Adjuvant antiestrogen therapy started 08/19/2017  Anastrozole toxicities: 1.  Hot flashes improved significantly.  She stopped both Effexor and gabapentin..  Surveillance: 1.  Mammograms and ultrasound 04/11/2018: No mammographic evidence of malignancy, surgical changes consistent with prior lumpectomy, breast density category C 2.  Breast exam 11/19/2018: Benign  Return to clinic in 1 year for follow-up

## 2018-11-20 ENCOUNTER — Telehealth: Payer: Self-pay | Admitting: Nutrition

## 2018-11-20 NOTE — Telephone Encounter (Signed)
Patient requested phone call from RD. Attempted to call patient at home phone number and it is out of service. There is no cell or work number listed.

## 2019-02-27 ENCOUNTER — Other Ambulatory Visit: Payer: Self-pay

## 2019-02-27 DIAGNOSIS — Z170 Estrogen receptor positive status [ER+]: Principal | ICD-10-CM

## 2019-02-27 DIAGNOSIS — C50411 Malignant neoplasm of upper-outer quadrant of right female breast: Secondary | ICD-10-CM

## 2019-02-27 NOTE — Progress Notes (Signed)
Pt wanted to discuss about getting in touch with the dietician at the cancer center. Pt states that she was trying to follow up but had a very busy schedule and is ready to get some counseling. Will send referral to dietician and have them contact pt for further information. Pt verbalized understanding.

## 2019-02-28 ENCOUNTER — Other Ambulatory Visit: Payer: Self-pay

## 2019-02-28 ENCOUNTER — Telehealth: Payer: Self-pay | Admitting: Hematology and Oncology

## 2019-02-28 DIAGNOSIS — Z170 Estrogen receptor positive status [ER+]: Principal | ICD-10-CM

## 2019-02-28 DIAGNOSIS — C50411 Malignant neoplasm of upper-outer quadrant of right female breast: Secondary | ICD-10-CM

## 2019-02-28 NOTE — Progress Notes (Signed)
Pt requesting to get nutrition management counseling for cholesterol. Sent in dietician referral for nutrition and management

## 2019-02-28 NOTE — Telephone Encounter (Signed)
Cancelled appt per 3/12 sch message - spoke with patient and patient is aware that its cancelled and that they have been referred to Nutrition and Diabetes management center.

## 2019-03-05 ENCOUNTER — Inpatient Hospital Stay: Payer: BLUE CROSS/BLUE SHIELD | Admitting: Nutrition

## 2019-03-06 ENCOUNTER — Other Ambulatory Visit: Payer: Self-pay | Admitting: Specialist

## 2019-03-06 ENCOUNTER — Other Ambulatory Visit: Payer: Self-pay

## 2019-03-06 DIAGNOSIS — E7800 Pure hypercholesterolemia, unspecified: Secondary | ICD-10-CM

## 2019-03-06 DIAGNOSIS — Z853 Personal history of malignant neoplasm of breast: Secondary | ICD-10-CM

## 2019-05-28 ENCOUNTER — Ambulatory Visit
Admission: RE | Admit: 2019-05-28 | Discharge: 2019-05-28 | Disposition: A | Discharge status: Home / Self Care | Payer: BC Managed Care – PPO | Source: Ambulatory Visit | Attending: Specialist | Admitting: Specialist

## 2019-05-28 ENCOUNTER — Other Ambulatory Visit: Payer: Self-pay

## 2019-05-28 DIAGNOSIS — Z853 Personal history of malignant neoplasm of breast: Secondary | ICD-10-CM

## 2019-06-18 ENCOUNTER — Ambulatory Visit: Payer: BLUE CROSS/BLUE SHIELD | Admitting: Dietician

## 2019-06-20 ENCOUNTER — Encounter: Payer: Self-pay | Admitting: Dietician

## 2019-06-20 ENCOUNTER — Other Ambulatory Visit: Payer: Self-pay

## 2019-06-20 ENCOUNTER — Encounter: Payer: BC Managed Care – PPO | Attending: Hematology and Oncology | Admitting: Dietician

## 2019-06-20 DIAGNOSIS — Z713 Dietary counseling and surveillance: Secondary | ICD-10-CM

## 2019-06-20 NOTE — Progress Notes (Signed)
Medical Nutrition Therapy  Appt Start Time: 10:45am  End Time: 12:00pm  Primary concerns today: weight management   Referral diagnosis: E78.00 - high cholesterol Preferred learning style: no preference indicated Learning readiness: change in progress   NUTRITION ASSESSMENT   Anthropometrics  Weight: 158.3 lbs Height: 63.5 in  BMI: 27.3   Body Composition Scale 06/20/2019  Total Body Fat % 33.7  Visceral Fat 8  Fat-Free Mass % 66.2   Total Body Water % 47.6   Muscle-Mass lbs 29.1  Body Fat Displacement          Torso  lbs 32.9         Left Leg  lbs 6.5         Right Leg  lbs 6.5         Left Arm  lbs 3.2         Right Arm   lbs 3.2   Clinical Medical Hx: breast cancer Medications: anastrozole, lisinopril, valacyclovir   Psychosocial/Lifestyle 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 Very pleasant and personable. Asked many good questions during today's visit.   24-Hr Dietary Recall First Meal: bagel thin + fried egg + slice of cheese + 1/2 apple  Snack: none Second Meal: chicken + roasted cauliflower/ sweet potatoes/ brussels sprouts Snack: none Third Meal: [place] food (chips + salsa + chicken + rice)  Snack: 100% fruit popsicle   Beverages: water (Coke 2x/month)   Food & Nutrition Related Hx Dietary Hx: Avoids fast food. Not a picky eater, will eat just about anything and enjoys eating healthy. Likes salads with dressing, and asked what type of dressing would be the best options. States she often eats out on weekends and thinks this is unhealthy.  Has changed her diet over the past month, which consists of reduced portion sizes, decreased intake of sweets, and focusing on eating plenty of fruits, vegetables, and whole grains.   States she is taking medication d/t hx of breast cancer and high blood pressure which she believes is contributing to her weight gain over the past couple of years. States her increase in weight bothers her, usually weighs around ~135  lbs.   Estimated Daily Fluid Intake: 64+ oz Supplements: none stated  GI / Other Notable Symptoms: constipation (medication side effect)  Stress / Self-Care: Previous stress in recent years r/t cancer diagnosis, now much better since cancer was removed and no chemo needed.   Physical Activity  Current average weekly physical activity: fast-paced walking for 1 hour a day on 5 days/week. States she believes she needs to be more physically active, specifically structured physical activity to target her abdominal region. Education provided on the recommendations of aiming for 150 minutes of physical activity per week consisting of combined cardiovascular and weight baring/ strengthening exercises.   Estimated Energy Needs Calories: 1600 Carbohydrate: 180g Protein: 100g Fat: 53g   NUTRITION DIAGNOSIS  Unintended weight gain (Golden-3.4) related to medication side effects as evidenced by patient reported weight gain of ~20 pounds since taking lisinopril and anastrozole.    NUTRITION INTERVENTION  Nutrition education (E-1) on the following topics:  . General healthful nutrition and balanced diet  . Weight and factors that influence bodyweight  . Importance of fiber and plant-based foods, especially influence on heart health   Handouts Provided Include   MyPlate   Meal Ideas   Learning Style & Readiness for Change Teaching method utilized: Visual & Auditory  Demonstrated degree of understanding via: Teach Back  Barriers to learning/adherence to lifestyle change: None Identified   Goals Established by Pt . To incorporate a strengthening exercise 3 days per week in addition to walking for 1 hour on work days.    MONITORING & EVALUATION Dietary intake, weekly physical activity, and goals in 6 weeks.  RD's Notes for Next Visit  . Heart Healthy Nutrition Therapy (esp Fiber & Types of Fat) . Body Comp Scale   Next Steps  Patient is to return to [facility] for follow up visit in  approximately 6 weeks. Contact [facility] via phone or email with questions or concerns.

## 2019-06-20 NOTE — Patient Instructions (Addendum)
   Aim to incorporate a strengthening exercise 3 days per week. (And keep up the walking!)  Continue to keep up the GREAT work! Balance is key. Eating 3+ times/day, consuming a variety of foods/ food groups, and drinking plenty of water are all great habits. Plant-based foods, especially whole grains and vegetables, are great sources of that heart-healthy fiber.   See you in about 6 weeks!

## 2019-07-31 ENCOUNTER — Ambulatory Visit: Payer: BC Managed Care – PPO | Admitting: Dietician

## 2019-08-19 ENCOUNTER — Encounter: Payer: Self-pay | Admitting: Internal Medicine

## 2019-09-05 ENCOUNTER — Ambulatory Visit (INDEPENDENT_AMBULATORY_CARE_PROVIDER_SITE_OTHER): Payer: BC Managed Care – PPO | Admitting: Physician Assistant

## 2019-09-05 ENCOUNTER — Encounter: Payer: Self-pay | Admitting: Physician Assistant

## 2019-09-05 VITALS — BP 136/96 | HR 89 | Temp 97.9°F | Ht 64.0 in | Wt 153.0 lb

## 2019-09-05 DIAGNOSIS — R110 Nausea: Secondary | ICD-10-CM | POA: Diagnosis not present

## 2019-09-05 NOTE — Progress Notes (Signed)
Chief Complaint: Nausea   HPI:    Tonya Oconnell is a 58 year old female with a past medical history as listed below including breast cancer, known to Dr. Hilarie Fredrickson, who was referred to me by Tempie Hoist, FNP for a complaint of nausea.      08/10/2015 colonoscopy Dr. Hilarie Fredrickson with a sessile polyp in the cecum and otherwise normal.  Pathology showed tubular adenoma.  Repeat was recommended in 5 years.    Today, the patient tells me that since mid July she has been nauseous.  This is constant almost every day, some days are slightly better than others but she is not sure why.  Does tell me she is anxious due to a diagnosis of breast cancer back in 2018, now she is more worried about other symptoms that "pop up".  Initially was tried on Omeprazole 40 mg daily by her PCP but this made no difference to her symptoms.  She was then given Zofran which also did not help at all.  Patient knows that something just "does not feel right".    Denies fever, chills, heartburn, reflux, epigastric pain, blood in her stool or change in bowel habits.  Past Medical History:  Diagnosis Date  . Breast cancer ([place]) 04/2017   right  . Personal history of radiation therapy   . Stress headaches     Past Surgical History:  Procedure Laterality Date  . BREAST LUMPECTOMY Right   . COLONOSCOPY WITH PROPOFOL  08/10/2015  . GANGLION CYST EXCISION Left   . RADIOACTIVE SEED GUIDED PARTIAL MASTECTOMY WITH AXILLARY SENTINEL LYMPH NODE BIOPSY Right 05/17/2017   Procedure: RIGHT BREAST RADIOACTIVE SEED GUIDED PARTIAL MASTECTOMY WITH SENTINEL LYMPH NODE MAPPING;  Surgeon: Erroll Luna, MD;  Location: [facility];  Service: General;  Laterality: Right;  [facility] VAGINAL HYSTERECTOMY     partial    Current Outpatient Medications  Medication Sig Dispense Refill  . anastrozole (ARIMIDEX) 1 MG tablet Take 1 tablet (1 mg total) by mouth daily. Start 07/26/17 90 tablet 3  . lisinopril (PRINIVIL,ZESTRIL) 20 MG tablet Take 1  tablet (20 mg total) by mouth daily.    . valACYclovir (VALTREX) 1000 MG tablet Take 1,000 mg by mouth daily.     No current facility-administered medications for this visit.     Allergies as of 09/05/2019 - Review Complete 09/05/2019  Allergen Reaction Noted  . Morphine and related Nausea And Vomiting 05/09/2017  . Sulfa antibiotics Swelling 08/06/2012    Family History  Problem Relation Age of Onset  . Colon polyps Father     Social History   Socioeconomic History  . Marital status: Divorced    Spouse name: Not on file  . Number of children: 1  . Years of education: Not on file  . Highest education level: Not on file  Occupational History  . Occupation: Admin. Assistant  Social Needs  . Financial resource strain: Not on file  . Food insecurity    Worry: Not on file    Inability: Not on file  . Transportation needs    Medical: Not on file    Non-medical: Not on file  Tobacco Use  . Smoking status: Never Smoker  . Smokeless tobacco: Never Used  Substance and Sexual Activity  . Alcohol use: No  . Drug use: No  . Sexual activity: Not on file  Lifestyle  . Physical activity    Days per week: Not on file    Minutes per session: Not on  file  . Stress: Not on file  Relationships  . Social Herbalist on phone: Not on file    Gets together: Not on file    Attends religious service: Not on file    Active member of club or organization: Not on file    Attends meetings of clubs or organizations: Not on file    Relationship status: Not on file  . Intimate partner violence    Fear of current or ex partner: Not on file    Emotionally abused: Not on file    Physically abused: Not on file    Forced sexual activity: Not on file  Other Topics Concern  . Not on file  Social History Narrative  . Not on file    Review of Systems:    Constitutional: No weight loss, fever or chills Skin: No rash  Cardiovascular: No chest pain Respiratory: No SOB   Gastrointestinal: See HPI and otherwise negative Genitourinary: No dysuria Neurological: No headache, dizziness or syncope Musculoskeletal: No new muscle or joint pain Hematologic: No bleeding  Psychiatric: +anxiety   Physical Exam:  Vital signs: BP (!) 136/96   Pulse 89   Temp 97.9 F (36.6 C)   Ht 5\' 4"  (1.626 m)   Wt 153 lb (69.4 kg)   BMI 26.26 kg/m   Constitutional:   Pleasant Caucasian female appears to be in NAD, Well developed, Well nourished, alert and cooperative Head:  Normocephalic and atraumatic. Eyes:   PEERL, EOMI. No icterus. Conjunctiva pink. Ears:  Normal auditory acuity. Neck:  Supple Throat: Oral cavity and pharynx without inflammation, swelling or lesion.  Respiratory: Respirations even and unlabored. Lungs clear to auscultation bilaterally.   No wheezes, crackles, or rhonchi.  Cardiovascular: Normal S1, S2. No MRG. Regular rate and rhythm. No peripheral edema, cyanosis or pallor.  Gastrointestinal:  Soft, nondistended, nontender. No rebound or guarding. Normal bowel sounds. No appreciable masses or hepatomegaly. Rectal:  Not performed.  Msk:  Symmetrical without gross deformities. Without edema, no deformity or joint abnormality.  Neurologic:  Alert and  oriented x4;  grossly normal neurologically.  Skin:   Dry and intact without significant lesions or rashes. Psychiatric: Anxious, Demonstrates good judgement and reason without abnormal affect or behaviors.  No recent labs or imaging.  Assessment: 1. Nausea: Constant over the past 2-1/2 months, no change with Omeprazole 40 mg daily or Zofran; consider gastritis+/-H. pylori versus anxiety versus gallbladder etiology  Plan: 1.  Discussed options with the patient.  She would like to proceed with an EGD for further evaluation because "something just is not right".  Scheduled EGD in the [place] with Dr. Hilarie Fredrickson.  Did discuss risks, benefits, limitations and alternatives and patient agrees to proceed. 2.  Patient  declined all medications today because "nothing has helped". 3.  Patient to follow in clinic per recommendations from Dr. Hilarie Fredrickson after time of procedure.  Tonya Newer, PA-C [facility] 09/05/2019, 9:45 AM  Cc: Tempie Hoist, FNP

## 2019-09-05 NOTE — Patient Instructions (Signed)
You have been scheduled for an endoscopy. Please follow written instructions given to you at your visit today. If you use inhalers (even only as needed), please bring them with you on the day of your procedure.  If you are age 56 or younger, your body mass index should be between 19-25. Your Body mass index is 26.26 kg/m. If this is out of the aformentioned range listed, please consider follow up with your Primary Care Provider.    Thank you for choosing me and [facility].  Dennison Bulla

## 2019-09-19 NOTE — Progress Notes (Signed)
Addendum: Reviewed and agree with assessment and management plan. Azizi Bally M, MD  

## 2019-09-23 ENCOUNTER — Ambulatory Visit: Payer: BC Managed Care – PPO | Admitting: Internal Medicine

## 2019-10-10 ENCOUNTER — Encounter: Payer: BC Managed Care – PPO | Admitting: Internal Medicine

## 2019-11-24 NOTE — Progress Notes (Signed)
Patient Care Team: Tempie Hoist, FNP as PCP - General (Family Medicine) Erroll Luna, MD as Consulting Physician (General Surgery) Nicholas Lose, MD as Consulting Physician (Hematology and Oncology) Kyung Rudd, MD as Consulting Physician (Radiation Oncology)  DIAGNOSIS:    ICD-10-CM   1. Malignant neoplasm of upper-outer quadrant of right breast in female, estrogen receptor positive ([place])  C50.411    Z17.0     SUMMARY OF ONCOLOGIC HISTORY: Oncology History  Malignant neoplasm of upper-outer quadrant of right breast in female, estrogen receptor positive ([place])  04/07/2017 Initial Diagnosis   Screening detected right breast asymmetry and distortion at 11:30: 1.3 cm Right breast biopsy 11:30: Grade 1 IDC with microcalcifications ER 90%, PR 80%, Ki-67 2%, HER-2 negative ratio 1.44; T1cN0 stage IA AJCC 8   05/17/2017 Surgery   Right lumpectomy: IDC grade 1, 0.9 cm, DCIS grade 1, margins negative, 0/6 lymph nodes negative, ER 90%, PR 80%, HER-2 negative, Ki-67 2% T1b N0 stage IA   06/20/2017 - 08/10/2017 Radiation Therapy   Adjuvant radiation   08/21/2017 -  Anti-estrogen oral therapy   Anastrozole 1 mg daily     CHIEF COMPLIANT: Follow-up of right breast cancer on anastrozole therapy  INTERVAL HISTORY: Tonya Oconnell is a 58 y.o. with above-mentioned history of right breast cancer treated with lumpectomy, radiation, and who is currently on anti-estrogen therapy with anastrozole. Mammogram on 05/28/19 showed no evidence of malignancy bilaterally. She presents to the clinic today for follow-up.  She has mild hot flashes and mild aches and pains but otherwise doing quite well.  REVIEW OF SYSTEMS:   Constitutional: Denies fevers, chills or abnormal weight loss Eyes: Denies blurriness of vision Ears, nose, mouth, throat, and face: Denies mucositis or sore throat Respiratory: Denies cough, dyspnea or wheezes Cardiovascular: Denies palpitation, chest discomfort Gastrointestinal: Denies  nausea, heartburn or change in bowel habits Skin: Denies abnormal skin rashes Lymphatics: Denies new lymphadenopathy or easy bruising Neurological: Denies numbness, tingling or new weaknesses Behavioral/Psych: Mood is stable, no new changes  Extremities: No lower extremity edema Breast: denies any pain or lumps or nodules in either breasts All other systems were reviewed with the patient and are negative.  I have reviewed the past medical history, past surgical history, social history and family history with the patient and they are unchanged from previous note.  ALLERGIES:  is allergic to morphine and related and sulfa antibiotics.  MEDICATIONS:  Current Outpatient Medications  Medication Sig Dispense Refill  . anastrozole (ARIMIDEX) 1 MG tablet Take 1 tablet (1 mg total) by mouth daily. Start 07/26/17 90 tablet 3  . lisinopril (PRINIVIL,ZESTRIL) 20 MG tablet Take 1 tablet (20 mg total) by mouth daily.    . valACYclovir (VALTREX) 500 MG tablet Take 500 mg by mouth daily. Can take 2 tablets if need to be     No current facility-administered medications for this visit.     PHYSICAL EXAMINATION: ECOG PERFORMANCE STATUS: 1 - Symptomatic but completely ambulatory  Vitals:   11/25/19 1139  BP: (!) 144/106  Pulse: 94  Resp: 18  Temp: 97.6 F (36.4 C)  SpO2: 100%   Filed Weights   11/25/19 1139  Weight: 157 lb (71.2 kg)    GENERAL: alert, no distress and comfortable SKIN: skin color, texture, turgor are normal, no rashes or significant lesions EYES: normal, Conjunctiva are pink and non-injected, sclera clear OROPHARYNX: no exudate, no erythema and lips, buccal mucosa, and tongue normal  NECK: supple, thyroid normal size, non-tender, without nodularity  LYMPH: no palpable lymphadenopathy in the cervical, axillary or inguinal LUNGS: clear to auscultation and percussion with normal breathing effort HEART: regular rate & rhythm and no murmurs and no lower extremity edema ABDOMEN:  abdomen soft, non-tender and normal bowel sounds MUSCULOSKELETAL: no cyanosis of digits and no clubbing  NEURO: alert & oriented x 3 with fluent speech, no focal motor/sensory deficits EXTREMITIES: No lower extremity edema BREAST: No palpable masses or nodules in either right or left breasts. No palpable axillary supraclavicular or infraclavicular adenopathy no breast tenderness or nipple discharge. (exam performed in the presence of a chaperone)  LABORATORY DATA:  I have reviewed the data as listed CMP Latest Ref Rng & Units 04/19/2017  Glucose 70 - 140 mg/dl 113  BUN 7.0 - 26.0 mg/dL 9.7  Creatinine 0.6 - 1.1 mg/dL 1.0  Sodium 136 - 145 mEq/L 144  Potassium 3.5 - 5.1 mEq/L 3.8  CO2 22 - 29 mEq/L 28  Calcium 8.4 - 10.4 mg/dL 9.6  Total Protein 6.4 - 8.3 g/dL 7.4  Total Bilirubin 0.20 - 1.20 mg/dL 0.29  Alkaline Phos 40 - 150 U/L 84  AST 5 - 34 U/L 16  ALT 0 - 55 U/L 14    Lab Results  Component Value Date   WBC 6.1 04/19/2017   HGB 13.4 04/19/2017   HCT 40.1 04/19/2017   MCV 87.7 04/19/2017   PLT 360 04/19/2017   NEUTROABS 3.8 04/19/2017    ASSESSMENT & PLAN:  Malignant neoplasm of upper-outer quadrant of right breast in female, estrogen receptor positive ([place]) 05/17/2017 Right lumpectomy: IDC grade 1, 0.9 cm, DCIS grade 1, margins negative, 0/6 lymph nodes negative, ER 90%, PR 80%, HER-2 negative, Ki-67 2% T1b N0 stage IA  Treatment summary: 1. Based on the favorable pathology report, I did not recommend Oncotype DX testing because it is felt that she has a low-grade disease and the tumor is less than 1 cm. 2. adjuvant radiation therapycompleted 08/10/2017 3. Adjuvant antiestrogen therapystarted 08/19/2017  Anastrozole toxicities: 1.Hot flashes improved significantly.   2.  Muscle aches and stiffness related to letrozole: Instructed her to take letrozole at bedtime instead.  Patient is stressed out about her father's health.  He has been pushing them away by being  mean to them.  Surveillance: 1.Mammograms  05/28/2019: No mammographic evidence of malignancy, surgical changes consistent with prior lumpectomy, breast density category C 2.  Breast exam 11/25/2019: Benign  Return to clinic in1 yearfor follow-up    No orders of the defined types were placed in this encounter.  The patient has a good understanding of the overall plan. she agrees with it. she will call with any problems that may develop before the next visit here.  Nicholas Lose, MD 11/25/2019  Julious Oka Dorshimer, am acting as scribe for Dr. Nicholas Lose.  I have reviewed the above documentation for accuracy and completeness, and I agree with the above.

## 2019-11-25 ENCOUNTER — Inpatient Hospital Stay: Payer: BC Managed Care – PPO | Attending: Hematology and Oncology | Admitting: Hematology and Oncology

## 2019-11-25 ENCOUNTER — Other Ambulatory Visit: Payer: Self-pay

## 2019-11-25 DIAGNOSIS — R232 Flushing: Secondary | ICD-10-CM | POA: Diagnosis not present

## 2019-11-25 DIAGNOSIS — Z923 Personal history of irradiation: Secondary | ICD-10-CM | POA: Insufficient documentation

## 2019-11-25 DIAGNOSIS — Z170 Estrogen receptor positive status [ER+]: Secondary | ICD-10-CM | POA: Diagnosis not present

## 2019-11-25 DIAGNOSIS — Z79899 Other long term (current) drug therapy: Secondary | ICD-10-CM | POA: Diagnosis not present

## 2019-11-25 DIAGNOSIS — Z79811 Long term (current) use of aromatase inhibitors: Secondary | ICD-10-CM | POA: Insufficient documentation

## 2019-11-25 DIAGNOSIS — C50411 Malignant neoplasm of upper-outer quadrant of right female breast: Secondary | ICD-10-CM | POA: Insufficient documentation

## 2019-11-25 DIAGNOSIS — M791 Myalgia, unspecified site: Secondary | ICD-10-CM | POA: Diagnosis not present

## 2019-11-25 MED ORDER — ANASTROZOLE 1 MG PO TABS: 1.0000 mg | ORAL_TABLET | Freq: Every day | ORAL | 3 refills | Status: DC

## 2019-11-25 NOTE — Assessment & Plan Note (Signed)
05/17/2017 Right lumpectomy: IDC grade 1, 0.9 cm, DCIS grade 1, margins negative, 0/6 lymph nodes negative, ER 90%, PR 80%, HER-2 negative, Ki-67 2% T1b N0 stage IA  Treatment summary: 1. Based on the favorable pathology report, I did not recommend Oncotype DX testing because it is felt that she has a low-grade disease and the tumor is less than 1 cm. 2. adjuvant radiation therapycompleted 08/10/2017 3. Adjuvant antiestrogen therapystarted 08/19/2017  Anastrozole toxicities: 1.Hot flashes improved significantly.   2.  Muscle aches and stiffness related to letrozole: Instructed her to take letrozole at bedtime instead.  Patient is stressed out about her father's health.  He has been pushing them away by being mean to them.  Surveillance: 1.Mammograms  05/28/2019: No mammographic evidence of malignancy, surgical changes consistent with prior lumpectomy, breast density category C 2.  Breast exam 11/25/2019: Benign  Return to clinic in1 yearfor follow-up

## 2019-11-26 ENCOUNTER — Telehealth: Payer: Self-pay | Admitting: Hematology and Oncology

## 2019-11-26 NOTE — Telephone Encounter (Signed)
I talk with patient regarding schedule  

## 2020-05-25 ENCOUNTER — Other Ambulatory Visit: Payer: Self-pay | Admitting: Hematology and Oncology

## 2020-05-25 DIAGNOSIS — Z9889 Other specified postprocedural states: Secondary | ICD-10-CM

## 2020-06-08 ENCOUNTER — Other Ambulatory Visit: Payer: Self-pay

## 2020-06-08 ENCOUNTER — Ambulatory Visit
Admission: RE | Admit: 2020-06-08 | Discharge: 2020-06-08 | Disposition: A | Discharge status: Home / Self Care | Payer: BC Managed Care – PPO | Source: Ambulatory Visit | Attending: Hematology and Oncology | Admitting: Hematology and Oncology

## 2020-06-08 DIAGNOSIS — Z9889 Other specified postprocedural states: Secondary | ICD-10-CM

## 2020-10-16 ENCOUNTER — Encounter: Payer: Self-pay | Admitting: Internal Medicine

## 2020-11-24 ENCOUNTER — Ambulatory Visit: Payer: BC Managed Care – PPO | Admitting: Hematology and Oncology

## 2020-12-01 ENCOUNTER — Other Ambulatory Visit: Payer: Self-pay

## 2020-12-01 ENCOUNTER — Ambulatory Visit (AMBULATORY_SURGERY_CENTER): Payer: Self-pay | Admitting: *Deleted

## 2020-12-01 VITALS — Ht 64.0 in | Wt 170.0 lb

## 2020-12-01 DIAGNOSIS — Z86010 Personal history of colonic polyps: Secondary | ICD-10-CM

## 2020-12-01 MED ORDER — NA SULFATE-K SULFATE-MG SULF 17.5-3.13-1.6 GM/177ML PO SOLN: 1.0000 | Freq: Once | ORAL | 0 refills | Status: AC

## 2020-12-01 NOTE — Progress Notes (Signed)
No egg or soy allergy known to patient  No issues with past sedation with any surgeries or procedures No intubation problems in the past  No FH of Malignant Hyperthermia No diet pills per patient No home 02 use per patient  No blood thinners per patient  Pt denies issues with constipation  No A fib or A flutter  EMMI video to pt or via [place] 19 guidelines implemented in PV today with Pt and RN  Pt is fully vaccinated  for [employer] given to pt in PV today , Code to Pharmacy   Due to the COVID-19 pandemic we are asking patients to follow certain guidelines.  Pt aware of COVID protocols and [facility] guidelines

## 2020-12-02 NOTE — Progress Notes (Signed)
Patient Care Team: Tempie Hoist, FNP as PCP - General (Family Medicine) Erroll Luna, MD as Consulting Physician (General Surgery) Nicholas Lose, MD as Consulting Physician (Hematology and Oncology) Kyung Rudd, MD as Consulting Physician (Radiation Oncology)  DIAGNOSIS:    ICD-10-CM   1. Malignant neoplasm of upper-outer quadrant of right breast in female, estrogen receptor positive ([place])  C50.411    Z17.0     SUMMARY OF ONCOLOGIC HISTORY: Oncology History  Malignant neoplasm of upper-outer quadrant of right breast in female, estrogen receptor positive ([place])  04/07/2017 Initial Diagnosis   Screening detected right breast asymmetry and distortion at 11:30: 1.3 cm Right breast biopsy 11:30: Grade 1 IDC with microcalcifications ER 90%, PR 80%, Ki-67 2%, HER-2 negative ratio 1.44; T1cN0 stage IA AJCC 8   05/17/2017 Surgery   Right lumpectomy: IDC grade 1, 0.9 cm, DCIS grade 1, margins negative, 0/6 lymph nodes negative, ER 90%, PR 80%, HER-2 negative, Ki-67 2% T1b N0 stage IA   06/20/2017 - 08/10/2017 Radiation Therapy   Adjuvant radiation   08/21/2017 -  Anti-estrogen oral therapy   Anastrozole 1 mg daily     CHIEF COMPLIANT: Follow-up of right breast cancer on anastrozole therapy  INTERVAL HISTORY: Tonya Oconnell is a 59 y.o. with above-mentioned history of right breast cancer treated with lumpectomy, radiation, and who is currently on anti-estrogen therapy with anastrozole. Mammogram on 06/08/20 showed no evidence of malignancy bilaterally. She presents to the clinic today for follow-up.   ALLERGIES:  is allergic to morphine and related, sulfa antibiotics, and codeine.  MEDICATIONS:  Current Outpatient Medications  Medication Sig Dispense Refill  . anastrozole (ARIMIDEX) 1 MG tablet Take 1 tablet (1 mg total) by mouth daily. Start 07/26/17 90 tablet 3  . lisinopril (PRINIVIL,ZESTRIL) 20 MG tablet Take 1 tablet (20 mg total) by mouth daily.    . rosuvastatin (CRESTOR) 10 MG  tablet Take by mouth.    . valACYclovir (VALTREX) 500 MG tablet Take 500 mg by mouth daily. Can take 2 tablets if need to be    . Vitamin D, Ergocalciferol, (DRISDOL) 1.25 MG (50000 UNIT) CAPS capsule Take by mouth.     No current facility-administered medications for this visit.    PHYSICAL EXAMINATION: ECOG PERFORMANCE STATUS: 1 - Symptomatic but completely ambulatory  Vitals:   12/03/20 1135  BP: (!) 141/92  Pulse: 80  Resp: 18  Temp: 98.2 F (36.8 C)  SpO2: 99%   Filed Weights   12/03/20 1135  Weight: 171 lb 3.2 oz (77.7 kg)    BREAST: No palpable masses or nodules in either right or left breasts. No palpable axillary supraclavicular or infraclavicular adenopathy no breast tenderness or nipple discharge. (exam performed in the presence of a chaperone)  LABORATORY DATA:  I have reviewed the data as listed CMP Latest Ref Rng & Units 04/19/2017  Glucose 70 - 140 mg/dl 113  BUN 7.0 - 26.0 mg/dL 9.7  Creatinine 0.6 - 1.1 mg/dL 1.0  Sodium 136 - 145 mEq/L 144  Potassium 3.5 - 5.1 mEq/L 3.8  CO2 22 - 29 mEq/L 28  Calcium 8.4 - 10.4 mg/dL 9.6  Total Protein 6.4 - 8.3 g/dL 7.4  Total Bilirubin 0.20 - 1.20 mg/dL 0.29  Alkaline Phos 40 - 150 U/L 84  AST 5 - 34 U/L 16  ALT 0 - 55 U/L 14    Lab Results  Component Value Date   WBC 6.1 04/19/2017   HGB 13.4 04/19/2017   HCT 40.1 04/19/2017  MCV 87.7 04/19/2017   PLT 360 04/19/2017   NEUTROABS 3.8 04/19/2017    ASSESSMENT & PLAN:  Malignant neoplasm of upper-outer quadrant of right breast in female, estrogen receptor positive ([place]) 05/17/2017 Right lumpectomy: IDC grade 1, 0.9 cm, DCIS grade 1, margins negative, 0/6 lymph nodes negative, ER 90%, PR 80%, HER-2 negative, Ki-67 2% T1b N0 stage IA  Treatment summary: 1. Based on the favorable pathology report, I did not recommend Oncotype DX testing because it is felt that she has a low-grade disease and the tumor is less than 1 cm. 2. adjuvant radiation therapycompleted  08/10/2017 3. Adjuvant antiestrogen therapystarted 08/19/2017  Anastrozole toxicities: 1.Hot flashes improved significantly. 2.Muscle aches and stiffness related to letrozole: Instructed her to take letrozole at bedtime instead.  She wishes to stay on antiestrogen therapy for the full 7 years.  Surveillance: 1.Mammograms 06/08/2020: No mammographic evidence of malignancy, surgical changes consistent with prior lumpectomy, breast density category C 2.Breast exam 12/03/2020: Benign I discussed with her about obtaining a bone density test with her primary care physician.  Return to clinic in1 yearfor follow-up    No orders of the defined types were placed in this encounter.  The patient has a good understanding of the overall plan. she agrees with it. she will call with any problems that may develop before the next visit here.  Total time spent: 20 mins including face to face time and time spent for planning, charting and coordination of care  Nicholas Lose, MD 12/03/2020  I, Cloyde Reams Dorshimer, am acting as scribe for Dr. Nicholas Lose.  I have reviewed the above documentation for accuracy and completeness, and I agree with the above.

## 2020-12-02 NOTE — Assessment & Plan Note (Signed)
05/17/2017 Right lumpectomy: IDC grade 1, 0.9 cm, DCIS grade 1, margins negative, 0/6 lymph nodes negative, ER 90%, PR 80%, HER-2 negative, Ki-67 2% T1b N0 stage IA  Treatment summary: 1. Based on the favorable pathology report, I did not recommend Oncotype DX testing because it is felt that she has a low-grade disease and the tumor is less than 1 cm. 2. adjuvant radiation therapycompleted 08/10/2017 3. Adjuvant antiestrogen therapystarted 08/19/2017  Anastrozole toxicities: 1.Hot flashes improved significantly. 2.Muscle aches and stiffness related to letrozole: Instructed her to take letrozole at bedtime instead.  Patient is stressed out about her father's health. He has been pushing them away by being mean to them.  Surveillance: 1.Mammograms 06/08/2020: No mammographic evidence of malignancy, surgical changes consistent with prior lumpectomy, breast density category C 2.Breast exam 12/03/2020: Benign  Return to clinic in1 yearfor follow-up

## 2020-12-03 ENCOUNTER — Encounter: Payer: Self-pay | Admitting: Internal Medicine

## 2020-12-03 ENCOUNTER — Other Ambulatory Visit: Payer: Self-pay | Admitting: *Deleted

## 2020-12-03 ENCOUNTER — Other Ambulatory Visit: Payer: Self-pay

## 2020-12-03 ENCOUNTER — Inpatient Hospital Stay: Payer: BC Managed Care – PPO | Attending: Hematology and Oncology | Admitting: Hematology and Oncology

## 2020-12-03 DIAGNOSIS — Z923 Personal history of irradiation: Secondary | ICD-10-CM | POA: Diagnosis not present

## 2020-12-03 DIAGNOSIS — Z79899 Other long term (current) drug therapy: Secondary | ICD-10-CM | POA: Insufficient documentation

## 2020-12-03 DIAGNOSIS — Z170 Estrogen receptor positive status [ER+]: Secondary | ICD-10-CM | POA: Diagnosis not present

## 2020-12-03 DIAGNOSIS — Z79811 Long term (current) use of aromatase inhibitors: Secondary | ICD-10-CM | POA: Diagnosis not present

## 2020-12-03 DIAGNOSIS — C50411 Malignant neoplasm of upper-outer quadrant of right female breast: Secondary | ICD-10-CM | POA: Insufficient documentation

## 2020-12-03 MED ORDER — ANASTROZOLE 1 MG PO TABS: 1.0000 mg | ORAL_TABLET | Freq: Every day | ORAL | 3 refills | Status: DC

## 2020-12-03 NOTE — Progress Notes (Signed)
Pt called stating her GYN no longer preforms bone density scans in the office.  States she will need an order for bone density faxed to [facility] in [place].  Per MD orders placed and successfully faxed to [phone].

## 2020-12-08 ENCOUNTER — Telehealth: Payer: Self-pay | Admitting: Hematology and Oncology

## 2020-12-08 NOTE — Telephone Encounter (Signed)
Scheduled per 12/16 los. Called and spoke with pt, confirmed 12/16 appt

## 2020-12-14 ENCOUNTER — Encounter: Payer: Self-pay | Admitting: Internal Medicine

## 2020-12-14 ENCOUNTER — Ambulatory Visit (AMBULATORY_SURGERY_CENTER): Payer: BC Managed Care – PPO | Admitting: Internal Medicine

## 2020-12-14 ENCOUNTER — Other Ambulatory Visit: Payer: Self-pay

## 2020-12-14 VITALS — BP 143/75 | HR 68 | Temp 97.2°F | Resp 11 | Ht 64.0 in | Wt 170.0 lb

## 2020-12-14 DIAGNOSIS — Z860100 Personal history of colon polyps, unspecified: Secondary | ICD-10-CM

## 2020-12-14 MED ORDER — SODIUM CHLORIDE 0.9 % IV SOLN: 500.0000 mL | INTRAVENOUS | Status: DC

## 2020-12-14 NOTE — Patient Instructions (Signed)
Handouts provided on hemorrhoids  YOU HAD AN ENDOSCOPIC PROCEDURE TODAY AT THE [facility]:   Refer to the procedure report that was given to you for any specific questions about what was found during the examination.  If the procedure report does not answer your questions, please call your gastroenterologist to clarify.  If you requested that your care partner not be given the details of your procedure findings, then the procedure report has been included in a sealed envelope for you to review at your convenience later.  YOU SHOULD EXPECT: Some feelings of bloating in the abdomen. Passage of more gas than usual.  Walking can help get rid of the air that was put into your GI tract during the procedure and reduce the bloating. If you had a lower endoscopy (such as a colonoscopy or flexible sigmoidoscopy) you may notice spotting of blood in your stool or on the toilet paper. If you underwent a bowel prep for your procedure, you may not have a normal bowel movement for a few days.  Please Note:  You might notice some irritation and congestion in your nose or some drainage.  This is from the oxygen used during your procedure.  There is no need for concern and it should clear up in a day or so.  SYMPTOMS TO REPORT IMMEDIATELY:   Following lower endoscopy (colonoscopy or flexible sigmoidoscopy):  Excessive amounts of blood in the stool  Significant tenderness or worsening of abdominal pains  Swelling of the abdomen that is new, acute  Fever of 100F or higher   For urgent or emergent issues, a gastroenterologist can be reached at any hour by calling [phone]. Do not use MyChart messaging for urgent concerns.    DIET:  We do recommend a small meal at first, but then you may proceed to your regular diet.  Drink plenty of fluids but you should avoid alcoholic beverages for 24 hours.  ACTIVITY:  You should plan to take it easy for the rest of today and you should NOT DRIVE or use  heavy machinery until tomorrow (because of the sedation medicines used during the test).    FOLLOW UP: Our staff will call the number listed on your records 48-72 hours following your procedure to check on you and address any questions or concerns that you may have regarding the information given to you following your procedure. If we do not reach you, we will leave a message.  We will attempt to reach you two times.  During this call, we will ask if you have developed any symptoms of COVID 19. If you develop any symptoms (ie: fever, flu-like symptoms, shortness of breath, cough etc.) before then, please call [phone].  If you test positive for Covid 19 in the 2 weeks post procedure, please call and report this information to [place].    If any biopsies were taken you will be contacted by phone or by letter within the next 1-3 weeks.  Please call us at [phone] if you have not heard about the biopsies in 3 weeks.    SIGNATURES/CONFIDENTIALITY: You and/or your care partner have signed paperwork which will be entered into your electronic medical record.  These signatures attest to the fact that that the information above on your After Visit Summary has been reviewed and is understood.  Full responsibility of the confidentiality of this discharge information lies with you and/or your care-partner.

## 2020-12-14 NOTE — Progress Notes (Signed)
To PACU, VSS. Report to [email or web address] 

## 2020-12-14 NOTE — Op Note (Signed)
[name]: Tonya Oconnell Procedure Date: 12/14/2020 3:49 PM MRN: [id number] Endoscopist: Jerene Bears , MD Age: 59 Referring MD:  Date of Birth: 25-Feb-1961 Gender: Female Account #: [id number] Procedure:                Colonoscopy Indications:              High risk colon cancer surveillance: Personal                            history of adenoma less than 10 mm in size, Last                            colonoscopy: August 2016 Medicines:                Monitored Anesthesia Care Procedure:                Pre-Anesthesia Assessment:                           - Prior to the procedure, a History and Physical                            was performed, and patient medications and                            allergies were reviewed. The patient's tolerance of                            previous anesthesia was also reviewed. The risks                            and benefits of the procedure and the sedation                            options and risks were discussed with the patient.                            All questions were answered, and informed consent                            was obtained. Prior Anticoagulants: The patient has                            taken no previous anticoagulant or antiplatelet                            agents. ASA Grade Assessment: II - A patient with                            mild systemic disease. After reviewing the risks                            and benefits, the patient was deemed in  satisfactory condition to undergo the procedure.                           After obtaining informed consent, the colonoscope                            was passed under direct vision. Throughout the                            procedure, the patient's blood pressure, pulse, and                            oxygen saturations were monitored continuously. The                            Olympus [id number]) Colonoscope  was                            introduced through the anus and advanced to the                            cecum, identified by appendiceal orifice and                            ileocecal valve. The colonoscopy was performed                            without difficulty. The patient tolerated the                            procedure well. The quality of the bowel                            preparation was good. The ileocecal valve,                            appendiceal orifice, and rectum were photographed. Scope In: 4:02:47 PM Scope Out: 4:18:26 PM Scope Withdrawal Time: 0 hours 11 minutes 59 seconds  Total Procedure Duration: 0 hours 15 minutes 39 seconds  Findings:                 The digital rectal exam was normal.                           The entire examined colon appeared normal.                           Internal hemorrhoids were found during                            retroflexion. The hemorrhoids were small. Complications:            No immediate complications. Estimated Blood Loss:     Estimated blood loss: none. Impression:               - The entire examined colon is normal.                           -  Small internal hemorrhoids.                           - No specimens collected. Recommendation:           - Patient has a contact number available for                            emergencies. The signs and symptoms of potential                            delayed complications were discussed with the                            patient. Return to normal activities tomorrow.                            Written discharge instructions were provided to the                            patient.                           - Resume previous diet.                           - Continue present medications.                           - Repeat colonoscopy in 10 years for surveillance. Tonya Fiedler, MD 12/14/2020 4:21:27 PM This report has been signed electronically.

## 2020-12-14 NOTE — Progress Notes (Signed)
Pt's states no medical or surgical changes since previsit or office visit. 

## 2020-12-15 ENCOUNTER — Telehealth: Payer: Self-pay | Admitting: *Deleted

## 2020-12-15 NOTE — Telephone Encounter (Signed)
  Follow up Call-  Call back number 12/14/2020  Post procedure Call Back phone  # [phone]  Permission to leave phone message Yes  Some recent data might be hidden     Patient questions:  Do you have a fever, pain , or abdominal swelling? No. Pain Score  0 *  Have you tolerated food without any problems? Yes.    Have you been able to return to your normal activities? Yes.    Do you have any questions about your discharge instructions: Diet   No. Medications  No. Follow up visit  No.  Do you have questions or concerns about your Care? No.  Actions: * If pain score is 4 or above: No action needed, pain <4.  1. Have you developed a fever since your procedure? no  2.   Have you had an respiratory symptoms (SOB or cough) since your procedure? no  3.   Have you tested positive for COVID 19 since your procedure no  4.   Have you had any family members/close contacts diagnosed with the COVID 19 since your procedure?  no   If yes to any of these questions please route to Laverna Peace, RN and Karlton Lemon, RN

## 2020-12-21 ENCOUNTER — Telehealth: Payer: Self-pay | Admitting: Internal Medicine

## 2020-12-21 NOTE — Telephone Encounter (Signed)
Pt Is requesting a call back from a nurse to discuss her covid symptoms, pt spoke with a nurse and was advised to follow up if contracted the virus.

## 2020-12-21 NOTE — Telephone Encounter (Signed)
Spoke with pt- she started with covid symptoms on 12-16-20- two days after her procedure and then tested positive on 12-18-20.  Angelique Blonder and [place],  Just passing this along for your information.  Baxter Hire

## 2021-01-20 ENCOUNTER — Telehealth: Payer: Self-pay | Admitting: *Deleted

## 2021-01-20 NOTE — Telephone Encounter (Signed)
Per MD request RN placed call to pt regarding recent bone density report showing T Score as -1.5 on 01/19/21.  MD recommends pt to take Calcium 1200 mg p.o daily as well as Vitamin D 1000 mg p.o daily.  Pt also educated on importance of preforming weight bearing exercises daily.  Pt verbalized understanding and appreciative of the advice.

## 2021-06-07 ENCOUNTER — Other Ambulatory Visit: Payer: Self-pay | Admitting: Hematology and Oncology

## 2021-06-07 DIAGNOSIS — Z9889 Other specified postprocedural states: Secondary | ICD-10-CM

## 2021-06-16 ENCOUNTER — Ambulatory Visit
Admission: RE | Admit: 2021-06-16 | Discharge: 2021-06-16 | Disposition: A | Discharge status: Home / Self Care | Payer: BC Managed Care – PPO | Source: Ambulatory Visit | Attending: Hematology and Oncology | Admitting: Hematology and Oncology

## 2021-06-16 ENCOUNTER — Other Ambulatory Visit: Payer: Self-pay

## 2021-06-16 DIAGNOSIS — Z9889 Other specified postprocedural states: Secondary | ICD-10-CM

## 2021-11-21 ENCOUNTER — Other Ambulatory Visit: Payer: Self-pay | Admitting: Hematology and Oncology

## 2021-12-03 ENCOUNTER — Inpatient Hospital Stay: Payer: BC Managed Care – PPO | Admitting: Hematology and Oncology

## 2021-12-20 IMAGING — MG DIGITAL DIAGNOSTIC BILAT W/ TOMO W/ CAD
6 of 9 series · 6 of 25 positions shown · non-contrast
Comparison: Previous exam(s).

CLINICAL DATA: History of RIGHT breast cancer in 9356 status post
lumpectomy and radiation therapy.

EXAM:
DIGITAL DIAGNOSTIC BILATERAL MAMMOGRAM WITH TOMOSYNTHESIS AND CAD
TECHNIQUE: Bilateral digital diagnostic mammography and breast tomosynthesis
was performed. The images were evaluated with computer-aided
detection.

[R MLO]
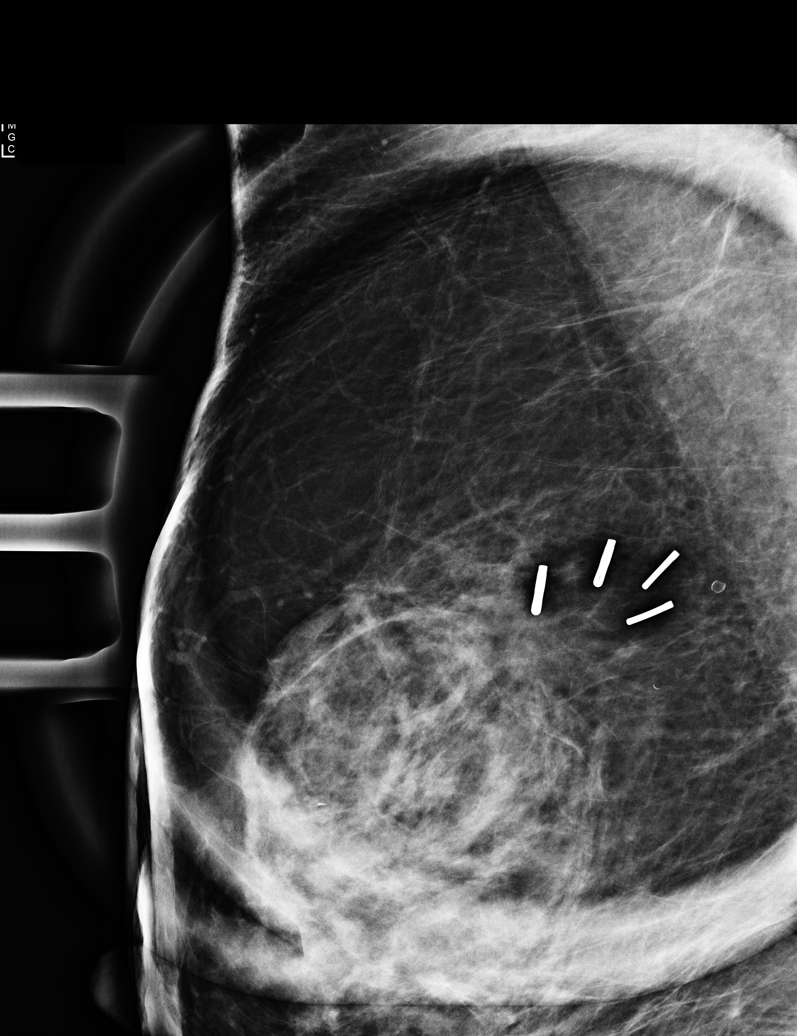

[R MLO synth-2D]
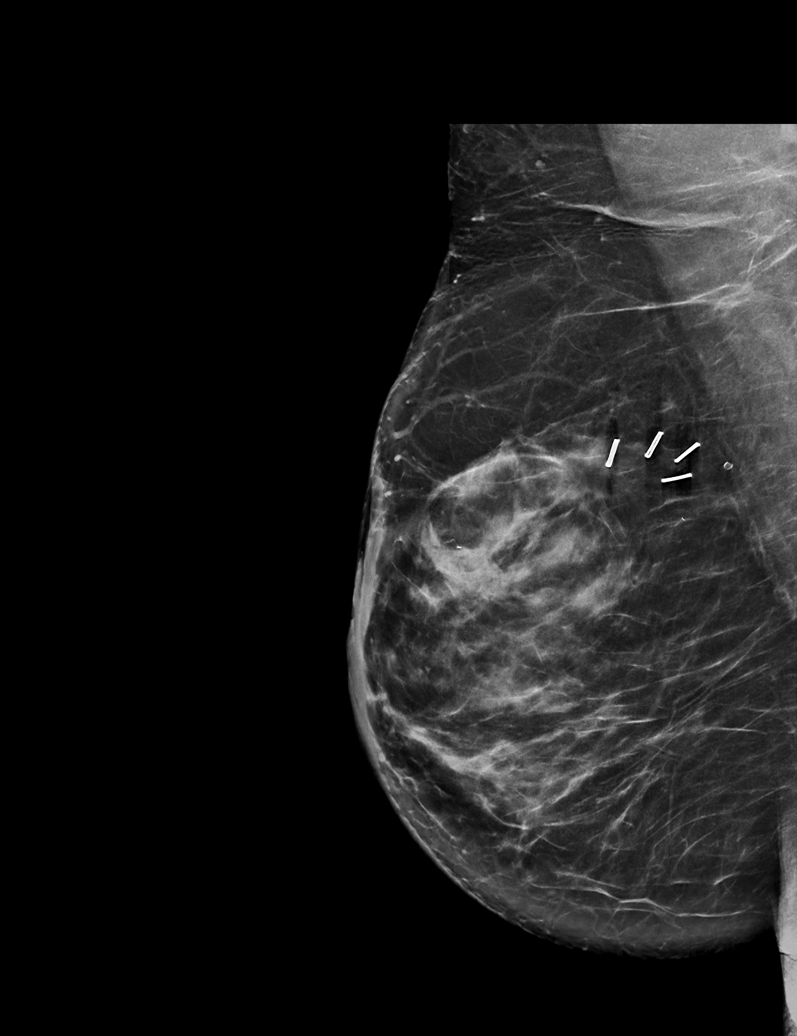

[R CC synth-2D]
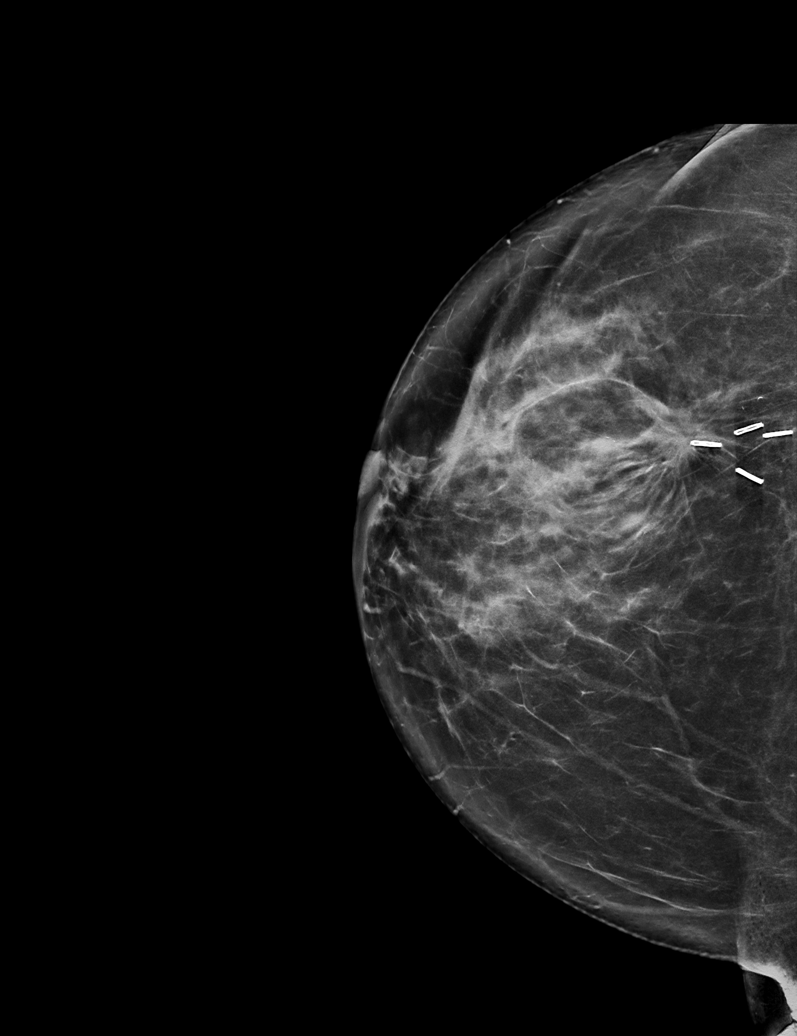

[L MLO synth-2D]
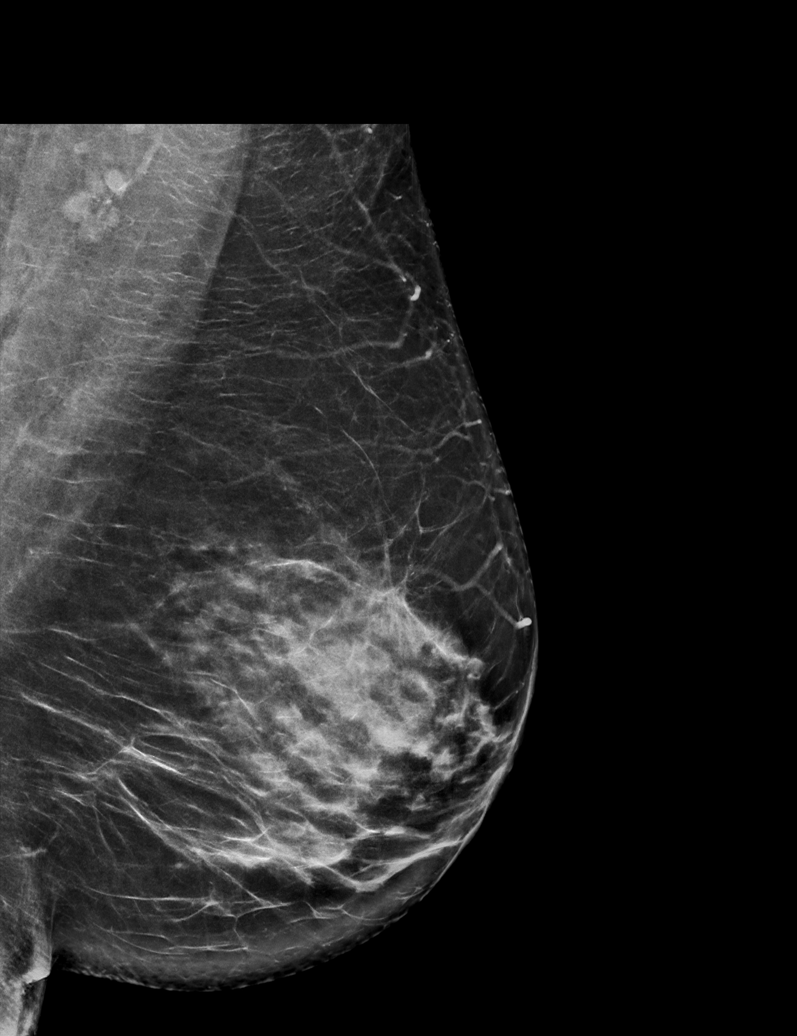

[L CC synth-2D]
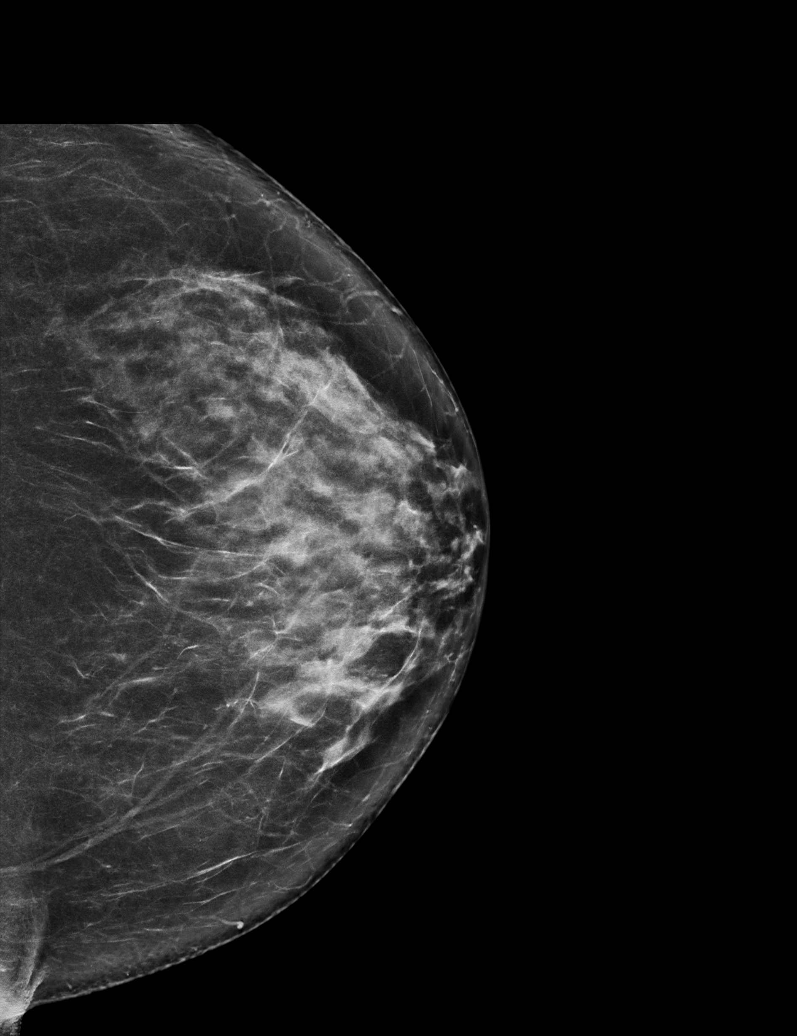

[L MLO tomo · tomo slice 41/81.0]
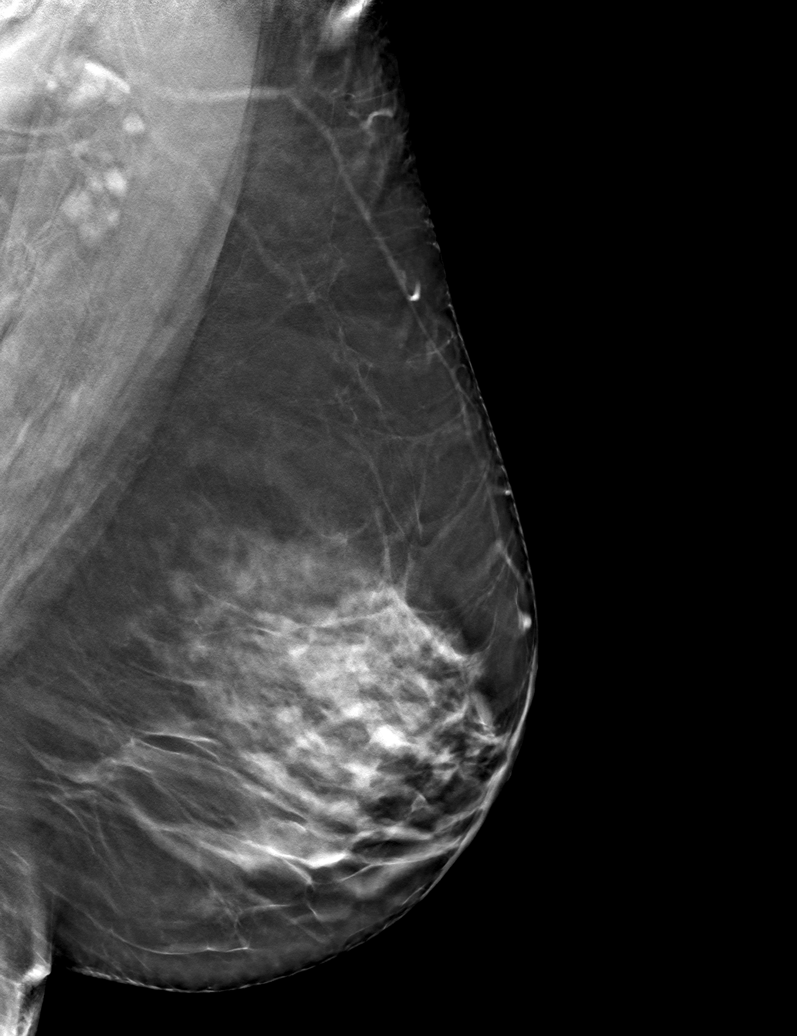

[6 of 25 positions shown; findings below may reference images not displayed]

ACR Breast Density Category c: The breast tissue is heterogeneously
dense, which may obscure small masses.
FINDINGS: There are stable postsurgical changes within the RIGHT breast. There
are no new dominant masses, suspicious calcifications or secondary
signs of malignancy within either breast.
IMPRESSION: No evidence of malignancy within either breast. Stable postsurgical
changes within the RIGHT breast.

RECOMMENDATION:
1.  Screening mammogram in one year.(Code:[id number])
2. Per protocol, as the patient is now 2 or more years status post
lumpectomy, she may return to annual screening mammography in 1
year. However, given the history of breast cancer, the patient
remains eligible for annual diagnostic mammography if preferred.

I have discussed the findings and recommendations with the patient.
If applicable, a reminder letter will be sent to the patient
regarding the next appointment.

BI-RADS CATEGORY  2: Benign.

## 2021-12-28 ENCOUNTER — Telehealth: Payer: Self-pay | Admitting: Hematology and Oncology

## 2021-12-28 NOTE — Telephone Encounter (Signed)
Sch per 1/10 inbasket, pt aware °

## 2021-12-30 ENCOUNTER — Inpatient Hospital Stay: Payer: BC Managed Care – PPO | Admitting: Hematology and Oncology

## 2022-01-06 NOTE — Assessment & Plan Note (Signed)
05/17/2017 Right lumpectomy: IDC grade 1, 0.9 cm, DCIS grade 1, margins negative, 0/6 lymph nodes negative, ER 90%, PR 80%, HER-2 negative, Ki-67 2% T1b N0 stage IA  Treatment summary: 1. Based on the favorable pathology report, I did not recommend Oncotype DX testing because it is felt that she has a low-grade disease and the tumor is less than 1 cm. 2. adjuvant radiation therapycompleted 08/10/2017 3. Adjuvant antiestrogen therapystarted 08/19/2017  Anastrozole toxicities: 1.Hot flashes improved significantly. 2.Muscle aches and stiffness related to letrozole: Instructed her to take letrozole at bedtime instead.  She wishes to stay on antiestrogen therapy for the full 7 years.  Surveillance: 1.Mammograms6/29/22: No mammographic evidence of malignancy, surgical changes consistent with prior lumpectomy, breast density category C 2.Breast exam1/20/23: Benign I discussed with her about obtaining a bone density test with her primary care physician.  Return to clinic in1 yearfor follow-up

## 2022-01-06 NOTE — Progress Notes (Signed)
Patient Care Team: Tempie Hoist, FNP as PCP - General (Family Medicine) Erroll Luna, MD as Consulting Physician (General Surgery) Nicholas Lose, MD as Consulting Physician (Hematology and Oncology) Kyung Rudd, MD as Consulting Physician (Radiation Oncology)  DIAGNOSIS:    ICD-10-CM   1. Malignant neoplasm of upper-outer quadrant of right breast in female, estrogen receptor positive ([place])  C50.411    Z17.0       SUMMARY OF ONCOLOGIC HISTORY: Oncology History  Malignant neoplasm of upper-outer quadrant of right breast in female, estrogen receptor positive ([place])  04/07/2017 Initial Diagnosis   Screening detected right breast asymmetry and distortion at 11:30: 1.3 cm Right breast biopsy 11:30: Grade 1 IDC with microcalcifications ER 90%, PR 80%, Ki-67 2%, HER-2 negative ratio 1.44; T1cN0 stage IA AJCC 8   05/17/2017 Surgery   Right lumpectomy: IDC grade 1, 0.9 cm, DCIS grade 1, margins negative, 0/6 lymph nodes negative, ER 90%, PR 80%, HER-2 negative, Ki-67 2% T1b N0 stage IA   06/20/2017 - 08/10/2017 Radiation Therapy   Adjuvant radiation   08/21/2017 -  Anti-estrogen oral therapy   Anastrozole 1 mg daily     CHIEF COMPLIANT: Follow-up of right breast cancer on anastrozole therapy  INTERVAL HISTORY: Tonya Oconnell is a 61 y.o. with above-mentioned history of right breast cancer treated with lumpectomy, radiation, and who is currently on anti-estrogen therapy with anastrozole. Mammogram on 06/16/2021 showed no evidence of malignancy bilaterally. She presents to the clinic today for follow-up.  Since she switched taking anastrozole at bedtime she has been doing much better.  Hot flashes and myalgias have markedly reduced.  Denies any lumps or nodules in the breast.  ALLERGIES:  is allergic to morphine and related, sulfa antibiotics, and codeine.  MEDICATIONS:  Current Outpatient Medications  Medication Sig Dispense Refill   anastrozole (ARIMIDEX) 1 MG tablet TAKE 1 TABLET  DAILY 90 tablet 3   lisinopril-hydrochlorothiazide (ZESTORETIC) 20-12.5 MG tablet Take 1 tablet by mouth daily.     rosuvastatin (CRESTOR) 10 MG tablet Take by mouth.     valACYclovir (VALTREX) 500 MG tablet Take 500 mg by mouth daily. Can take 2 tablets if need to be     Vitamin D, Ergocalciferol, (DRISDOL) 1.25 MG (50000 UNIT) CAPS capsule Take by mouth.     No current facility-administered medications for this visit.    PHYSICAL EXAMINATION: ECOG PERFORMANCE STATUS: 1 - Symptomatic but completely ambulatory  Vitals:   01/07/22 1209  BP: (!) 136/97  Pulse: 88  Resp: 17  Temp: 98.3 F (36.8 C)  SpO2: 100%   Filed Weights   01/07/22 1209  Weight: 174 lb 6.4 oz (79.1 kg)    BREAST: No palpable masses or nodules in either right or left breasts. No palpable axillary supraclavicular or infraclavicular adenopathy no breast tenderness or nipple discharge. (exam performed in the presence of a chaperone)  LABORATORY DATA:  I have reviewed the data as listed CMP Latest Ref Rng & Units 04/19/2017  Glucose 70 - 140 mg/dl 113  BUN 7.0 - 26.0 mg/dL 9.7  Creatinine 0.6 - 1.1 mg/dL 1.0  Sodium 136 - 145 mEq/L 144  Potassium 3.5 - 5.1 mEq/L 3.8  CO2 22 - 29 mEq/L 28  Calcium 8.4 - 10.4 mg/dL 9.6  Total Protein 6.4 - 8.3 g/dL 7.4  Total Bilirubin 0.20 - 1.20 mg/dL 0.29  Alkaline Phos 40 - 150 U/L 84  AST 5 - 34 U/L 16  ALT 0 - 55 U/L 14  Lab Results  Component Value Date   WBC 6.1 04/19/2017   HGB 13.4 04/19/2017   HCT 40.1 04/19/2017   MCV 87.7 04/19/2017   PLT 360 04/19/2017   NEUTROABS 3.8 04/19/2017    ASSESSMENT & PLAN:  Malignant neoplasm of upper-outer quadrant of right breast in female, estrogen receptor positive ([place]) 05/17/2017 Right lumpectomy: IDC grade 1, 0.9 cm, DCIS grade 1, margins negative, 0/6 lymph nodes negative, ER 90%, PR 80%, HER-2 negative, Ki-67 2% T1b N0 stage IA   Treatment summary: 1. Based on the favorable pathology report, I did not recommend  Oncotype DX testing because it is felt that she has a low-grade disease and the tumor is less than 1 cm. 2. adjuvant radiation therapy completed 08/10/2017 3. Adjuvant antiestrogen therapy started 08/19/2017   Anastrozole toxicities: 1.  Hot flashes improved significantly.    2.  Muscle aches and stiffness related to letrozole: Instructed her to take letrozole at bedtime instead.   She wishes to stay on antiestrogen therapy for the full 7 years.   Surveillance: 1.  Mammograms  06/16/21: No mammographic evidence of malignancy, surgical changes consistent with prior lumpectomy, breast density category C 2.  Breast exam 01/07/22: Benign I discussed with her about obtaining a bone density test with her primary care physician.   Return to clinic in 1 year for follow-up  No orders of the defined types were placed in this encounter.  The patient has a good understanding of the overall plan. she agrees with it. she will call with any problems that may develop before the next visit here.  Total time spent: 20 mins including face to face time and time spent for planning, charting and coordination of care  Rulon Eisenmenger, MD, MPH 01/07/2022  I, Thana Ates, am acting as scribe for Dr. Nicholas Lose.  I have reviewed the above documentation for accuracy and completeness, and I agree with the above.

## 2022-01-07 ENCOUNTER — Other Ambulatory Visit: Payer: Self-pay

## 2022-01-07 ENCOUNTER — Inpatient Hospital Stay: Payer: 59 | Attending: Hematology and Oncology | Admitting: Hematology and Oncology

## 2022-01-07 DIAGNOSIS — Z79811 Long term (current) use of aromatase inhibitors: Secondary | ICD-10-CM | POA: Insufficient documentation

## 2022-01-07 DIAGNOSIS — Z170 Estrogen receptor positive status [ER+]: Secondary | ICD-10-CM | POA: Diagnosis not present

## 2022-01-07 DIAGNOSIS — Z923 Personal history of irradiation: Secondary | ICD-10-CM | POA: Insufficient documentation

## 2022-01-07 DIAGNOSIS — R232 Flushing: Secondary | ICD-10-CM | POA: Diagnosis not present

## 2022-01-07 DIAGNOSIS — Z79899 Other long term (current) drug therapy: Secondary | ICD-10-CM | POA: Insufficient documentation

## 2022-01-07 DIAGNOSIS — C50411 Malignant neoplasm of upper-outer quadrant of right female breast: Secondary | ICD-10-CM | POA: Insufficient documentation

## 2022-01-07 MED ORDER — ANASTROZOLE 1 MG PO TABS: 1.0000 mg | ORAL_TABLET | Freq: Every day | ORAL | 11 refills | Status: DC

## 2022-03-29 ENCOUNTER — Other Ambulatory Visit: Payer: Self-pay | Admitting: Hematology and Oncology

## 2022-03-29 DIAGNOSIS — Z1231 Encounter for screening mammogram for malignant neoplasm of breast: Secondary | ICD-10-CM

## 2022-06-23 ENCOUNTER — Ambulatory Visit: Payer: 59

## 2022-07-06 ENCOUNTER — Ambulatory Visit: Payer: 59

## 2022-07-08 ENCOUNTER — Ambulatory Visit
Admission: RE | Admit: 2022-07-08 | Discharge: 2022-07-08 | Disposition: A | Discharge status: Home / Self Care | Payer: 59 | Source: Ambulatory Visit | Attending: Hematology and Oncology | Admitting: Hematology and Oncology

## 2022-07-08 DIAGNOSIS — Z1231 Encounter for screening mammogram for malignant neoplasm of breast: Secondary | ICD-10-CM

## 2022-12-22 ENCOUNTER — Other Ambulatory Visit: Payer: Self-pay | Admitting: *Deleted

## 2022-12-22 MED ORDER — ANASTROZOLE 1 MG PO TABS: 1.0000 mg | ORAL_TABLET | Freq: Every day | ORAL | 11 refills | Status: DC

## 2022-12-22 NOTE — Progress Notes (Signed)
Received anastrozole rx refill request via fax from [place], [place]. Unsuccessful attempt to E-prescribe. Called pharmacy and per pharmacist, E-prescribe didn't go through because this rx has been previously refilled by paper copy. Rx refilled via phone with pharmacist read back confirmation.

## 2023-01-09 ENCOUNTER — Inpatient Hospital Stay: Payer: 59 | Admitting: Hematology and Oncology

## 2023-01-20 ENCOUNTER — Ambulatory Visit: Payer: 59 | Admitting: Hematology and Oncology

## 2023-02-08 ENCOUNTER — Inpatient Hospital Stay: Payer: 59 | Admitting: Hematology and Oncology

## 2023-02-27 ENCOUNTER — Telehealth: Payer: Self-pay | Admitting: Family Medicine

## 2023-02-27 NOTE — Telephone Encounter (Signed)
Patient called to cancel appointment, patient had a death in the family.

## 2023-03-01 ENCOUNTER — Inpatient Hospital Stay: Payer: 59 | Admitting: Hematology and Oncology

## 2023-06-27 ENCOUNTER — Telehealth: Payer: Self-pay

## 2023-06-27 ENCOUNTER — Other Ambulatory Visit: Payer: Self-pay | Admitting: Hematology and Oncology

## 2023-06-27 DIAGNOSIS — Z1231 Encounter for screening mammogram for malignant neoplasm of breast: Secondary | ICD-10-CM

## 2023-06-27 NOTE — Telephone Encounter (Signed)
Pt called to schedule yearly f/u with MD 07/19/23. She lives in [place] and it would be best for her to come the same day as her MM. Scheduled pt per her request.

## 2023-07-16 NOTE — Progress Notes (Signed)
Patient Care Team: Delorse Lek, FNP as PCP - General (Family Medicine) Harriette Bouillon, MD as Consulting Physician (General Surgery) Serena Croissant, MD as Consulting Physician (Hematology and Oncology) Dorothy Puffer, MD as Consulting Physician (Radiation Oncology)  DIAGNOSIS: No diagnosis found.  SUMMARY OF ONCOLOGIC HISTORY: Oncology History  Malignant neoplasm of upper-outer quadrant of right breast in female, estrogen receptor positive (HCC)  04/07/2017 Initial Diagnosis   Screening detected right breast asymmetry and distortion at 11:30: 1.3 cm Right breast biopsy 11:30: Grade 1 IDC with microcalcifications ER 90%, PR 80%, Ki-67 2%, HER-2 negative ratio 1.44; T1cN0 stage IA AJCC 8   05/17/2017 Surgery   Right lumpectomy: IDC grade 1, 0.9 cm, DCIS grade 1, margins negative, 0/6 lymph nodes negative, ER 90%, PR 80%, HER-2 negative, Ki-67 2% T1b N0 stage IA   06/20/2017 - 08/10/2017 Radiation Therapy   Adjuvant radiation   08/21/2017 -  Anti-estrogen oral therapy   Anastrozole 1 mg daily     CHIEF COMPLIANT: Follow-up of right breast cancer on anastrozole therapy   INTERVAL HISTORY: Tonya Oconnell is a 62 y.o. with above-mentioned history of right breast cancer treated with lumpectomy, radiation, and who is currently on anti-estrogen therapy with anastrozole. She presents to the clinic for a follow-up.    ALLERGIES:  is allergic to morphine and codeine, sulfa antibiotics, and codeine.  MEDICATIONS:  Current Outpatient Medications  Medication Sig Dispense Refill   anastrozole (ARIMIDEX) 1 MG tablet Take 1 tablet (1 mg total) by mouth daily. 30 tablet 11   lisinopril-hydrochlorothiazide (ZESTORETIC) 20-12.5 MG tablet Take 1 tablet by mouth daily.     rosuvastatin (CRESTOR) 10 MG tablet Take by mouth.     valACYclovir (VALTREX) 500 MG tablet Take 500 mg by mouth daily. Can take 2 tablets if need to be     Vitamin D, Ergocalciferol, (DRISDOL) 1.25 MG (50000 UNIT) CAPS capsule Take  by mouth.     No current facility-administered medications for this visit.    PHYSICAL EXAMINATION: ECOG PERFORMANCE STATUS: {CHL ONC ECOG [id number]}  There were no vitals filed for this visit. There were no vitals filed for this visit.  BREAST:*** No palpable masses or nodules in either right or left breasts. No palpable axillary supraclavicular or infraclavicular adenopathy no breast tenderness or nipple discharge. (exam performed in the presence of a chaperone)  LABORATORY DATA:  I have reviewed the data as listed    Latest Ref Rng & Units 04/19/2017    1:11 PM  CMP  Glucose 70 - 140 mg/dl 161   BUN 7.0 - 09.6 mg/dL 9.7   Creatinine 0.6 - 1.1 mg/dL 1.0   Sodium 045 - 409 mEq/L 144   Potassium 3.5 - 5.1 mEq/L 3.8   CO2 22 - 29 mEq/L 28   Calcium 8.4 - 10.4 mg/dL 9.6   Total Protein 6.4 - 8.3 g/dL 7.4   Total Bilirubin 8.11 - 1.20 mg/dL 9.14   Alkaline Phos 40 - 150 U/L 84   AST 5 - 34 U/L 16   ALT 0 - 55 U/L 14     Lab Results  Component Value Date   WBC 6.1 04/19/2017   HGB 13.4 04/19/2017   HCT 40.1 04/19/2017   MCV 87.7 04/19/2017   PLT 360 04/19/2017   NEUTROABS 3.8 04/19/2017    ASSESSMENT & PLAN:  No problem-specific Assessment & Plan notes found for this encounter.    No orders of the defined types were placed in this encounter.  The patient has a good understanding of the overall plan. she agrees with it. she will call with any problems that may develop before the next visit here. Total time spent: 30 mins including face to face time and time spent for planning, charting and co-ordination of care   Sherlyn Lick, CMA 07/16/23    I Janan Ridge am acting as a Neurosurgeon for [employer]  ***

## 2023-07-19 ENCOUNTER — Inpatient Hospital Stay: Payer: 59 | Attending: Hematology and Oncology | Admitting: Hematology and Oncology

## 2023-07-19 ENCOUNTER — Other Ambulatory Visit: Payer: Self-pay

## 2023-07-19 ENCOUNTER — Ambulatory Visit
Admission: RE | Admit: 2023-07-19 | Discharge: 2023-07-19 | Disposition: A | Discharge status: Home / Self Care | Payer: 59 | Source: Ambulatory Visit | Attending: Hematology and Oncology | Admitting: Hematology and Oncology

## 2023-07-19 VITALS — BP 143/95 | HR 74 | Temp 97.7°F | Resp 18 | Ht 64.0 in | Wt 173.6 lb

## 2023-07-19 DIAGNOSIS — Z1231 Encounter for screening mammogram for malignant neoplasm of breast: Secondary | ICD-10-CM

## 2023-07-19 DIAGNOSIS — Z170 Estrogen receptor positive status [ER+]: Secondary | ICD-10-CM | POA: Insufficient documentation

## 2023-07-19 DIAGNOSIS — Z79899 Other long term (current) drug therapy: Secondary | ICD-10-CM | POA: Insufficient documentation

## 2023-07-19 DIAGNOSIS — Z79624 Long term (current) use of inhibitors of nucleotide synthesis: Secondary | ICD-10-CM | POA: Insufficient documentation

## 2023-07-19 DIAGNOSIS — C50411 Malignant neoplasm of upper-outer quadrant of right female breast: Secondary | ICD-10-CM | POA: Diagnosis not present

## 2023-07-19 DIAGNOSIS — Z79811 Long term (current) use of aromatase inhibitors: Secondary | ICD-10-CM | POA: Diagnosis not present

## 2023-07-19 DIAGNOSIS — M8580 Other specified disorders of bone density and structure, unspecified site: Secondary | ICD-10-CM | POA: Diagnosis not present

## 2023-07-19 DIAGNOSIS — Z923 Personal history of irradiation: Secondary | ICD-10-CM | POA: Insufficient documentation

## 2023-07-19 NOTE — Assessment & Plan Note (Signed)
05/17/2017 Right lumpectomy: IDC grade 1, 0.9 cm, DCIS grade 1, margins negative, 0/6 lymph nodes negative, ER 90%, PR 80%, HER-2 negative, Ki-67 2% T1b N0 stage IA   Treatment summary: 1. Based on the favorable pathology report, I did not recommend Oncotype DX testing because it is felt that she has a low-grade disease and the tumor is less than 1 cm. 2. adjuvant radiation therapy completed 08/10/2017 3. Adjuvant antiestrogen therapy started 08/19/2017   Anastrozole toxicities: 1.  Hot flashes improved significantly.    2.  Muscle aches and stiffness related to letrozole: Instructed her to take letrozole at bedtime instead.   She wishes to stay on antiestrogen therapy for the full 7 years.   Surveillance: 1.  Mammograms 07/11/2022: Benign, breast density category C 2.  Breast exam 07/19/2023: Benign Bone density 01/19/2021: T-score -1.5: Osteopenia   Return to clinic in 1 year for follow-up

## 2023-07-20 ENCOUNTER — Telehealth: Payer: Self-pay

## 2023-07-20 ENCOUNTER — Other Ambulatory Visit: Payer: Self-pay

## 2023-07-20 DIAGNOSIS — M8580 Other specified disorders of bone density and structure, unspecified site: Secondary | ICD-10-CM

## 2023-07-20 DIAGNOSIS — Z79811 Long term (current) use of aromatase inhibitors: Secondary | ICD-10-CM

## 2023-07-20 NOTE — Telephone Encounter (Signed)
Returned Pt's call regarding bone density scan. Per MD, order faxed to [facility] in [place] at [phone] with receipt confirmation. Pt asks about results of 2022 DEXA scan. Pt shows osteopenia. Recommended Vit D, 1200mg  Calcium, and weight bearing exercises. Pt verbalized understanding.

## 2023-07-21 ENCOUNTER — Telehealth: Payer: Self-pay

## 2023-07-21 NOTE — Telephone Encounter (Signed)
Received VM from pt regarding bone density order and need for different location. Called [facility] regarding this and they requested order to be refaxed.Refaxed order with Attention Tania. Fax confirmation received.

## 2023-07-25 ENCOUNTER — Telehealth: Payer: Self-pay

## 2023-07-25 NOTE — Telephone Encounter (Signed)
Pt called to let us know [facility] tells her they have not received order for bone density that we have faxed multiple times.  I spoke with Tania at [facility] [phone]) who gave me an alternate fax number [phone]. Order faxed to provided number (confirmation received) and advised Tania to call me in an hour if she has not received it.   Pt is aware and verbalized thanks.

## 2023-11-20 ENCOUNTER — Other Ambulatory Visit: Payer: Self-pay | Admitting: Hematology and Oncology

## 2023-11-20 DIAGNOSIS — Z1231 Encounter for screening mammogram for malignant neoplasm of breast: Secondary | ICD-10-CM

## 2023-12-04 ENCOUNTER — Other Ambulatory Visit: Payer: Self-pay | Admitting: *Deleted

## 2023-12-15 ENCOUNTER — Other Ambulatory Visit: Payer: Self-pay | Admitting: *Deleted

## 2023-12-15 ENCOUNTER — Telehealth: Payer: Self-pay | Admitting: *Deleted

## 2023-12-15 MED ORDER — ANASTROZOLE 1 MG PO TABS: 1.0000 mg | ORAL_TABLET | Freq: Every day | ORAL | 3 refills | Status: DC

## 2023-12-15 MED ORDER — ANASTROZOLE 1 MG PO TABS: 1.0000 mg | ORAL_TABLET | Freq: Every day | ORAL | 11 refills | Status: DC

## 2023-12-15 NOTE — Telephone Encounter (Signed)
RN placed call to pt with recent bone density report showing T score -1.4.  pt states she currently takes Vitamin D and Calcium supplements.

## 2023-12-19 ENCOUNTER — Encounter: Payer: Self-pay | Admitting: Hematology and Oncology

## 2024-07-18 ENCOUNTER — Ambulatory Visit: Payer: 59 | Admitting: Hematology and Oncology

## 2024-07-19 ENCOUNTER — Ambulatory Visit
Admission: RE | Admit: 2024-07-19 | Discharge: 2024-07-19 | Disposition: A | Discharge status: Home / Self Care | Payer: 59 | Source: Ambulatory Visit | Attending: Hematology and Oncology | Admitting: Hematology and Oncology

## 2024-07-19 DIAGNOSIS — Z1231 Encounter for screening mammogram for malignant neoplasm of breast: Secondary | ICD-10-CM

## 2024-08-12 ENCOUNTER — Inpatient Hospital Stay: Payer: 59 | Attending: Hematology and Oncology | Admitting: Hematology and Oncology

## 2024-08-12 VITALS — BP 138/94 | HR 81 | Temp 98.3°F | Resp 18 | Ht 64.0 in | Wt 176.7 lb

## 2024-08-12 DIAGNOSIS — M8580 Other specified disorders of bone density and structure, unspecified site: Secondary | ICD-10-CM | POA: Diagnosis not present

## 2024-08-12 DIAGNOSIS — Z170 Estrogen receptor positive status [ER+]: Secondary | ICD-10-CM | POA: Insufficient documentation

## 2024-08-12 DIAGNOSIS — Z79811 Long term (current) use of aromatase inhibitors: Secondary | ICD-10-CM | POA: Diagnosis not present

## 2024-08-12 DIAGNOSIS — Z923 Personal history of irradiation: Secondary | ICD-10-CM | POA: Diagnosis not present

## 2024-08-12 DIAGNOSIS — Z1732 Human epidermal growth factor receptor 2 negative status: Secondary | ICD-10-CM | POA: Insufficient documentation

## 2024-08-12 DIAGNOSIS — C50411 Malignant neoplasm of upper-outer quadrant of right female breast: Secondary | ICD-10-CM | POA: Insufficient documentation

## 2024-08-12 DIAGNOSIS — Z1721 Progesterone receptor positive status: Secondary | ICD-10-CM | POA: Diagnosis not present

## 2024-08-12 NOTE — Progress Notes (Signed)
 Patient Care Team: Blair, Diane W, FNP as PCP - General (Family Medicine) Vanderbilt Ned, MD as Consulting Physician (General Surgery) Odean Potts, MD as Consulting Physician (Hematology and Oncology) Dewey Rush, MD as Consulting Physician (Radiation Oncology)  DIAGNOSIS:  Encounter Diagnosis  Name Primary?   Malignant neoplasm of upper-outer quadrant of right breast in female, estrogen receptor positive (HCC) Yes    SUMMARY OF ONCOLOGIC HISTORY: Oncology History  Malignant neoplasm of upper-outer quadrant of right breast in female, estrogen receptor positive (HCC)  04/07/2017 Initial Diagnosis   Screening detected right breast asymmetry and distortion at 11:30: 1.3 cm Right breast biopsy 11:30: Grade 1 IDC with microcalcifications ER 90%, PR 80%, Ki-67 2%, HER-2 negative ratio 1.44; T1cN0 stage IA AJCC 8   05/17/2017 Surgery   Right lumpectomy: IDC grade 1, 0.9 cm, DCIS grade 1, margins negative, 0/6 lymph nodes negative, ER 90%, PR 80%, HER-2 negative, Ki-67 2% T1b N0 stage IA   06/20/2017 - 08/10/2017 Radiation Therapy   Adjuvant radiation   08/21/2017 -  Anti-estrogen oral therapy   Anastrozole  1 mg daily     CHIEF COMPLIANT: Surveillance of breast cancer on anastrozole  therapy  HISTORY OF PRESENT ILLNESS:   History of Present Illness Tonya Oconnell is a 63 year old female with a history of breast cancer who presents for a follow-up on anti-estrogen therapy.  She has been on anti-estrogen therapy for seven years without significant side effects and reports feeling well overall. Her recent bone density tests show slight improvement. She is concerned about the risk of breast cancer recurrence, especially given her family history of longevity. Her original tumor was small and low risk, and she has been diligent in taking her medication. She has started cholesterol and blood pressure medications, which she attributes to the side effects of her current treatment. She is  considering continuing the anti-estrogen therapy for one more year and then reassessing her options.     ALLERGIES:  is allergic to morphine and codeine, sulfa antibiotics, and codeine.  MEDICATIONS:  Current Outpatient Medications  Medication Sig Dispense Refill   anastrozole  (ARIMIDEX ) 1 MG tablet Take 1 tablet (1 mg total) by mouth daily. 30 tablet 11   lisinopril -hydrochlorothiazide (ZESTORETIC) 20-12.5 MG tablet Take 1 tablet by mouth daily.     rosuvastatin (CRESTOR) 10 MG tablet Take by mouth.     valACYclovir (VALTREX) 500 MG tablet Take 500 mg by mouth daily. Can take 2 tablets if need to be     Vitamin D, Ergocalciferol, (DRISDOL) 1.25 MG (50000 UNIT) CAPS capsule Take by mouth.     No current facility-administered medications for this visit.    PHYSICAL EXAMINATION: ECOG PERFORMANCE STATUS: 1 - Symptomatic but completely ambulatory  Vitals:   08/12/24 0930  BP: (!) 138/94  Pulse: 81  Resp: 18  Temp: 98.3 F (36.8 C)  SpO2: 100%   Filed Weights   08/12/24 0930  Weight: 176 lb 11.2 oz (80.2 kg)    Physical Exam   (exam performed in the presence of a chaperone)  LABORATORY DATA:  I have reviewed the data as listed    Latest Ref Rng & Units 04/19/2017    1:11 PM  CMP  Glucose 70 - 140 mg/dl 886   BUN 7.0 - 73.9 mg/dL 9.7   Creatinine 0.6 - 1.1 mg/dL 1.0   Sodium 863 - 854 mEq/L 144   Potassium 3.5 - 5.1 mEq/L 3.8   CO2 22 - 29 mEq/L 28  Calcium 8.4 - 10.4 mg/dL 9.6   Total Protein 6.4 - 8.3 g/dL 7.4   Total Bilirubin 9.79 - 1.20 mg/dL 9.70   Alkaline Phos 40 - 150 U/L 84   AST 5 - 34 U/L 16   ALT 0 - 55 U/L 14     Lab Results  Component Value Date   WBC 6.1 04/19/2017   HGB 13.4 04/19/2017   HCT 40.1 04/19/2017   MCV 87.7 04/19/2017   PLT 360 04/19/2017   NEUTROABS 3.8 04/19/2017    ASSESSMENT & PLAN:  Malignant neoplasm of upper-outer quadrant of right breast in female, estrogen receptor positive (HCC) 05/17/2017 Right lumpectomy: IDC  grade 1, 0.9 cm, DCIS grade 1, margins negative, 0/6 lymph nodes negative, ER 90%, PR 80%, HER-2 negative, Ki-67 2% T1b N0 stage IA   Treatment summary: 1. Based on the favorable pathology report, I did not recommend Oncotype DX testing because it is felt that she has a low-grade disease and the tumor is less than 1 cm. 2. adjuvant radiation therapy completed 08/10/2017 3. Adjuvant antiestrogen therapy started 08/19/2017   Anastrozole  toxicities: 1.  Hot flashes improved significantly.    2.  Muscle aches and stiffness related to letrozole: Instructed her to take letrozole at bedtime instead.   We discussed the pros and cons of stopping antiestrogen therapy.  She would like to stay on it for 1 more year which will make it 8 years and then she will think about stopping it.  We also discussed the role of BCI testing to determine if she needs extended endocrine therapy but she would like to hold off on it until next year to make that decision.   Surveillance: 1.  Mammograms 07/23/2024: Benign, breast density category C: Patient would like to do her mammograms annually and then will from next year onwards. 2.  Breast exam 08/12/2024: Benign Bone density 12/04/2023: T-score -1.4: Osteopenia (used to be -1.5)   Return to clinic in 1 year for follow-up    No orders of the defined types were placed in this encounter.  The patient has a good understanding of the overall plan. she agrees with it. she will call with any problems that may develop before the next visit here. Total time spent: 30 mins including face to face time and time spent for planning, charting and co-ordination of care   Naomi MARLA Chad, MD 08/12/24

## 2024-08-12 NOTE — Assessment & Plan Note (Signed)
 05/17/2017 Right lumpectomy: IDC grade 1, 0.9 cm, DCIS grade 1, margins negative, 0/6 lymph nodes negative, ER 90%, PR 80%, HER-2 negative, Ki-67 2% T1b N0 stage IA   Treatment summary: 1. Based on the favorable pathology report, I did not recommend Oncotype DX testing because it is felt that she has a low-grade disease and the tumor is less than 1 cm. 2. adjuvant radiation therapy completed 08/10/2017 3. Adjuvant antiestrogen therapy started 08/19/2017   Anastrozole  toxicities: 1.  Hot flashes improved significantly.    2.  Muscle aches and stiffness related to letrozole: Instructed her to take letrozole at bedtime instead.   She wishes to stay on antiestrogen therapy for the full 7 years.   Surveillance: 1.  Mammograms 07/23/2024: Benign, breast density category C 2.  Breast exam 08/12/2024: Benign Bone density 12/04/2023: T-score -1.4: Osteopenia (used to be -1.5)   Return to clinic in 1 year for follow-up

## 2024-12-06 ENCOUNTER — Other Ambulatory Visit: Payer: Self-pay

## 2024-12-06 MED ORDER — ANASTROZOLE 1 MG PO TABS: 1.0000 mg | ORAL_TABLET | Freq: Every day | ORAL | 11 refills | Status: AC

## 2025-08-12 ENCOUNTER — Ambulatory Visit: Admitting: Hematology and Oncology
